# Patient Record
Sex: Female | Born: 1990 | Race: White | Hispanic: No | Marital: Married | Smoking: Never smoker
Health system: Southern US, Community
[De-identification: ages and names within clinical notes are randomized; demographics above are authoritative.]

## PROBLEM LIST (undated history)

## (undated) ENCOUNTER — Inpatient Hospital Stay (HOSPITAL_COMMUNITY): Payer: Self-pay

## (undated) DIAGNOSIS — IMO0002 Reserved for concepts with insufficient information to code with codable children: Secondary | ICD-10-CM

## (undated) DIAGNOSIS — T8859XA Other complications of anesthesia, initial encounter: Secondary | ICD-10-CM

## (undated) DIAGNOSIS — E039 Hypothyroidism, unspecified: Secondary | ICD-10-CM

## (undated) DIAGNOSIS — D62 Acute posthemorrhagic anemia: Secondary | ICD-10-CM

## (undated) DIAGNOSIS — T4145XA Adverse effect of unspecified anesthetic, initial encounter: Secondary | ICD-10-CM

## (undated) DIAGNOSIS — N83209 Unspecified ovarian cyst, unspecified side: Secondary | ICD-10-CM

## (undated) DIAGNOSIS — IMO0001 Reserved for inherently not codable concepts without codable children: Secondary | ICD-10-CM

## (undated) DIAGNOSIS — R87629 Unspecified abnormal cytological findings in specimens from vagina: Secondary | ICD-10-CM

## (undated) DIAGNOSIS — E079 Disorder of thyroid, unspecified: Secondary | ICD-10-CM

## (undated) DIAGNOSIS — K219 Gastro-esophageal reflux disease without esophagitis: Secondary | ICD-10-CM

## (undated) HISTORY — PX: APPENDECTOMY: SHX54

## (undated) HISTORY — PX: MOUTH SURGERY: SHX715

## (undated) HISTORY — PX: OTHER SURGICAL HISTORY: SHX169

---
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
## 2009-04-30 ENCOUNTER — Encounter (INDEPENDENT_AMBULATORY_CARE_PROVIDER_SITE_OTHER): Payer: Self-pay | Admitting: General Surgery

## 2009-04-30 ENCOUNTER — Inpatient Hospital Stay (HOSPITAL_COMMUNITY): Admission: EM | Admit: 2009-04-30 | Discharge: 2009-05-02 | Payer: Self-pay | Admitting: Emergency Medicine

## 2010-08-31 LAB — DIFFERENTIAL
Basophils Relative: 0 % (ref 0–1)
Lymphs Abs: 2.1 10*3/uL (ref 0.7–4.0)

## 2010-08-31 LAB — URINALYSIS, ROUTINE W REFLEX MICROSCOPIC
Glucose, UA: NEGATIVE mg/dL
Ketones, ur: NEGATIVE mg/dL
Nitrite: NEGATIVE
pH: 6.5 (ref 5.0–8.0)

## 2010-08-31 LAB — WET PREP, GENITAL
Clue Cells Wet Prep HPF POC: NONE SEEN
Yeast Wet Prep HPF POC: NONE SEEN

## 2010-08-31 LAB — BASIC METABOLIC PANEL
BUN: 12 mg/dL (ref 6–23)
Calcium: 9.2 mg/dL (ref 8.4–10.5)
Glucose, Bld: 126 mg/dL — ABNORMAL HIGH (ref 70–99)
Potassium: 3.8 mEq/L (ref 3.5–5.1)

## 2010-08-31 LAB — GC/CHLAMYDIA PROBE AMP, GENITAL
Chlamydia, DNA Probe: NEGATIVE
GC Probe Amp, Genital: NEGATIVE

## 2010-08-31 LAB — CBC
HCT: 38.8 % (ref 36.0–46.0)
MCHC: 34.1 g/dL (ref 30.0–36.0)
MCV: 87.7 fL (ref 78.0–100.0)
RBC: 4.43 MIL/uL (ref 3.87–5.11)
RDW: 13 % (ref 11.5–15.5)
WBC: 11.9 10*3/uL — ABNORMAL HIGH (ref 4.0–10.5)

## 2010-08-31 LAB — POCT PREGNANCY, URINE: Preg Test, Ur: NEGATIVE

## 2010-10-12 NOTE — Consult Note (Signed)
NAME:  Jane, Ayers NO.:  [id number]   MEDICAL RECORD NO.:  [id number]          PATIENT TYPE:  EMS   LOCATION:  MAJO                         FACILITY:  [facility]   PHYSICIAN:  Deanna Artis. Hickling, M.D.DATE OF BIRTH:  05-07-1991   DATE OF CONSULTATION:  DATE OF DISCHARGE:  07/24/2007                                 CONSULTATION   CHIEF COMPLAINT:  Left-sided weakness.   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   The patient has noted onset of headache that began 4 days ago in the  left posterior region and is now extending posteriorly and toward the  right side.  This is a dull achy but moderately severe pain.   The patient has complained of blurred vision in her left eye.  She noted  on Sunday that she had some problems with facial droop and that her eyes  seemed to be closing at different times.   She had alterations in taste on the left side of her tongue on Sunday.  The headache persisted.   On Monday, she had increasing problems with the left body,  particularly  her hand and to a lesser extent her leg.  Her mother said that she was  walking fine when she came into the emergency room.  She is having some  difficulty at this time.   REVIEW OF SYSTEMS:  CONSTITUTIONAL:  The patient has normal sleep and  appetite.  HEAD AND NECK:  No otitis media, pharyngitis, sinusitis.  PULMONARY:  No asthma, bronchitis, pneumonia.  CARDIOVASCULAR:  No  hypertension, murmur or congenital heart disease.  GASTROINTESTINAL:  No  nausea, vomiting, diarrhea, constipation.  GENITOURINARY:  No urinary  tract infection or  hematuria.  MUSCULOSKELETAL:  No fractures, sprains  or deformities.  SKIN:  No rash.  She has a small cafe-au-lait spot on  her right arm.  ENDOCRINE:  No diabetes.  She  has hypothyroidism,  etiology unknown.  NEUROPSYCHIATRIC:  Attention deficit disorder.  No  depression, anxiety and no prior history of histrionic behavior.  NEUROLOGIC:  The patient's review of systems is  coincident with the  history of the present illness.  The patient has had no other problems  with urinary incontinence, weakness on the right side, problems with  swallowing or chewing, changes in her hearing, or loss of control of her  bowels and bladder.  No seizures.   CURRENT MEDICATIONS:  Microgestin 1.5/30 for 21 days, which started 3  months ago.  She is in the middle of her period at this time.  Levothyroxine 50 mcg.  Daytrana 20-mg patch per day.   DRUG ALLERGIES:  PENICILLIN (HIVES).   FAMILY HISTORY:  No history of migraines.  Grandfather has hypertension,  diabetes.  Grandmother died from a ruptured brain aneurysm at age 74.  There are two brothers of hers with brain aneurysms.  No history of  mental retardation, blindness, deafness, birth defects  or autism.   SOCIAL HISTORY:  The patient is a Holiday representative at [employer],  getting Bs and Cs.  She plans to go to [employer] to study  veterinary medicine.   She does not use tobacco or alcohol.  She does not have a boyfriend.  She does not use drugs.   PHYSICAL EXAMINATION:  GENERAL:  On examination today, a well-developed,  well-nourished young woman who is tearful beyond what the situation  would predict.  VITAL SIGNS:  Temperature 97.6, blood pressure 137/87, resting pulse 98,  respirations 22, oxygen saturation 100%.  HEENT:  No signs of infection.  NECK:  Supple.  Full range of motion.  No cranial or cervical bruits.  LUNGS:  Clear to auscultation.  HEART:  No murmurs.  Pulses normal.  ABDOMEN:  Soft, nontender.  Bowel sounds normal.  EXTREMITIES:  Well-  formed without edema, cyanosis, or alterations in tone or tight heel  cords.  SKIN:  No lesions.  Vascular tone was normal.  NEUROLOGIC:  Mental  status:  The patient was awake and alert.  Her  vision was 20/100 -1 OS and 20/20 OD.  Fundi appeared to be normal.  Visual fields were full to double simultaneous stimuli.  Extraocular  movements full and conjugate.  She has a left peripheral facial weakness  with inability to elevate her eyebrow.  She can barely close her eyelid.  She has a facial droop.  She has a midline tongue and uvula.  Air  conduction greater than bone conduction bilaterally.  Motor examination:  The patient has no drift in her arms or her legs.  She said that she  could not hold her arms up, but she was able to do so.  She had  excellent strength on the right side, good fine motor movements.  On the  left side, she had giveaway strength.  She had some difficulty with  tapping her thumbs and her fingers, but it seemed forced.  She had much  better fine motor skills when she was performing stereognosis than when  she was trying to finger tap.  She had actually quite good strength in  her lower extremities as well, although she did not wiggle her toes as  well on the left side as the right.   Sensation showed a hypesthesia that respected the midline on her face.  She perceived a tuning fork greater on the right side than the left and  also pinprick greater right than left as well as cold right greater than  left.  The numbness was not as prominent on her neck, but it was present  on her shoulder and in her right arm and involved just the dermatomes of  the right arm and not her chest.  There also was some numbness on the  left leg in comparison with the right.  Despite that,  she had good  proprioception.  Vibratory sensation was somewhat decreased, right and  left.  She had good stereoagnosis bilaterally.  Cerebellar examination:  Good finger-to-nose.  No tremor.  Gait showed a slight left hemiparesis  although she did not circumduct the leg.  She scraped the toe along the  ground as she walked.  She was not  unsteady on her feet.  She could  perform a tandem.  She was able to walk on her toe and her heels.  She  had a negative Romberg.  Reflexes were symmetric and diminished.  There  was no reflex predominance.  She had bilateral flexor plantar responses.   IMPRESSION:  1. Left Bell's palsy. (351.0)  2. I cannot rule out a left optic neuritis.  3. Factitious left body examination in terms of sensation and motor in      my opinion. (342.82)   PLAN:  We need to look for the presence of demyelinating disease in her  optic nerves and in her brain.  This could explain all the findings that  we have and needs to be done because the patient should if this is true  be treated with 1000 mg of IV Solu-Medrol for 3 days.   Unfortunately, though I tried to speak out of her ear shot, she heard me  speaking with her mother, and she thinks that I do not believe her  concerning any of her conditions, which is not the case.  I pointed out  to her that we had to do the MRI scan to make certain that she did not  lose vision in her eye.  At this point, she is quite angry and wishes to  leave, but I was able to persuade her to go through the MRI scan.      Deanna Artis. Sharene Skeans, M.D.  Electronically Signed     [facility]  D:  07/24/2007  T:  07/24/2007  Job:  [id number]   cc:   Maryelizabeth Rowan, M.D.

## 2011-02-18 LAB — BASIC METABOLIC PANEL
BUN: 10
Calcium: 9.3
Chloride: 105
Potassium: 4.2

## 2011-02-18 LAB — DIFFERENTIAL
Eosinophils Absolute: 0.1
Lymphocytes Relative: 30

## 2011-02-18 LAB — PROTIME-INR
INR: 1
Prothrombin Time: 12.9

## 2011-02-18 LAB — APTT: aPTT: 26

## 2011-02-18 LAB — CBC
Platelets: 295
WBC: 5.3

## 2011-08-07 ENCOUNTER — Emergency Department (HOSPITAL_COMMUNITY)
Admission: EM | Admit: 2011-08-07 | Discharge: 2011-08-07 | Disposition: A | Discharge status: Home / Self Care | Payer: Private Health Insurance - Indemnity | Attending: Emergency Medicine | Admitting: Emergency Medicine

## 2011-08-07 ENCOUNTER — Encounter (HOSPITAL_COMMUNITY): Payer: Self-pay | Admitting: *Deleted

## 2011-08-07 DIAGNOSIS — F1010 Alcohol abuse, uncomplicated: Secondary | ICD-10-CM | POA: Insufficient documentation

## 2011-08-07 DIAGNOSIS — F10929 Alcohol use, unspecified with intoxication, unspecified: Secondary | ICD-10-CM

## 2011-08-07 LAB — ACETAMINOPHEN LEVEL: Acetaminophen (Tylenol), Serum: <15 ug/mL (ref 10–30)

## 2011-08-07 LAB — ETHANOL: Alcohol, Ethyl (B): 168 mg/dL — ABNORMAL HIGH (ref 0–11)

## 2011-08-07 LAB — RAPID URINE DRUG SCREEN, HOSP PERFORMED
Amphetamines: NOT DETECTED
Benzodiazepines: NOT DETECTED
Opiates: NOT DETECTED
Tetrahydrocannabinol: NOT DETECTED

## 2011-08-07 LAB — CBC
HCT: 38 % (ref 36.0–46.0)
MCH: 29.5 pg (ref 26.0–34.0)
MCHC: 34.2 g/dL (ref 30.0–36.0)
MCV: 86.4 fL (ref 78.0–100.0)
Platelets: 260 10*3/uL (ref 150–400)
RDW: 13.1 % (ref 11.5–15.5)

## 2011-08-07 LAB — COMPREHENSIVE METABOLIC PANEL
Albumin: 4.2 g/dL (ref 3.5–5.2)
BUN: 8 mg/dL (ref 6–23)
Calcium: 8.6 mg/dL (ref 8.4–10.5)
Chloride: 100 mEq/L (ref 96–112)
Creatinine, Ser: 0.67 mg/dL (ref 0.50–1.10)
Total Bilirubin: 0.2 mg/dL — ABNORMAL LOW (ref 0.3–1.2)
Total Protein: 7.5 g/dL (ref 6.0–8.3)

## 2011-08-07 LAB — URINALYSIS, ROUTINE W REFLEX MICROSCOPIC
Bilirubin Urine: NEGATIVE
Glucose, UA: NEGATIVE mg/dL
Hgb urine dipstick: NEGATIVE
Ketones, ur: NEGATIVE mg/dL
Protein, ur: NEGATIVE mg/dL
Urobilinogen, UA: 0.2 mg/dL (ref 0.0–1.0)

## 2011-08-07 LAB — POCT PREGNANCY, URINE: Preg Test, Ur: NEGATIVE

## 2011-08-07 MED ORDER — ZIPRASIDONE MESYLATE 20 MG IM SOLR
INTRAMUSCULAR | Status: AC
  Administered 2011-08-07: 20 mg via INTRAMUSCULAR
  Filled 2011-08-07: qty 20

## 2011-08-07 MED ORDER — AMMONIA AROMATIC IN INHA
RESPIRATORY_TRACT | Status: AC
  Administered 2011-08-07: 01:00:00
  Filled 2011-08-07: qty 10

## 2011-08-07 MED ORDER — SODIUM CHLORIDE 0.9 % IV BOLUS (SEPSIS)
500.0000 mL | Freq: Once | INTRAVENOUS | Status: AC
  Administered 2011-08-07: 500 mL via INTRAVENOUS

## 2011-08-07 MED ORDER — ZIPRASIDONE MESYLATE 20 MG IM SOLR
20.0000 mg | Freq: Once | INTRAMUSCULAR | Status: AC
  Administered 2011-08-07: 20 mg via INTRAMUSCULAR

## 2011-08-07 MED ORDER — SODIUM CHLORIDE 0.9 % IV BOLUS (SEPSIS)
1000.0000 mL | Freq: Once | INTRAVENOUS | Status: AC
  Administered 2011-08-07: 1000 mL via INTRAVENOUS

## 2011-08-07 MED ORDER — POTASSIUM CHLORIDE 10 MEQ/100ML IV SOLN
10.0000 meq | Freq: Once | INTRAVENOUS | Status: AC
  Administered 2011-08-07: 10 meq via INTRAVENOUS
  Filled 2011-08-07: qty 100

## 2011-08-07 NOTE — ED Provider Notes (Signed)
Patient reassessed at 9 AM. She is awake and alert in conversation with her mother. She is oriented x3 and she is tolerating her breakfast. She denies any trauma. She has no pain complaints.  She denies any suicidal ideation. Will assure patient can ambulate and discharge to the mother.  BP 94/44  Pulse 104  Temp 96.9 F (36.1 C)  Resp 18  SpO2 97%   Glynn Octave, MD 08/07/11 [phone]

## 2011-08-07 NOTE — Discharge Instructions (Signed)
Alcohol Intoxication  You have alcohol intoxication when the amount of alcohol that you have consumed has impaired your ability to mentally and physically function. There are a variety of factors that contribute to the level at which alcohol intoxication can occur, such as age, gender, weight, frequency of alcohol consumption, medication use, and the presence of other medical conditions, such as diabetes, seizures, or heart conditions.  The blood alcohol level test measures the concentration of alcohol in your blood. In most states, your blood alcohol level must be lower than 80 mg/dL (0.08%) to legally drive. However, many dangerous effects of alcohol can occur at much lower levels.  Alcohol directly impairs the normal chemical activity of the brain and is said to be a chemical depressant. Alcohol can cause drowsiness, stupor, respiratory failure, and coma. Other physical effects can include headache, vomiting, vomiting of blood, abdominal pain, a fast heartbeat, difficulty breathing, anxiety, and amnesia. Alcohol intoxication can also lead to dangerous and life-threatening activities, such as fighting, dangerous operation of vehicles or heavy machinery, and risky sexual behavior.  Alcohol can be especially dangerous when taken with other drugs. Some of these drugs are:   Sedatives.   Painkillers.   Marijuana.   Tranquilizers.   Antihistamines.   Muscle relaxants.   Seizure medicine.  Many of the effects of acute alcohol intoxication are temporary. However, repeated alcohol intoxication can lead to severe medical illnesses. If you have alcohol intoxication, you should:   Stay hydrated. Drink enough water and fluids to keep your urine clear or pale yellow. Avoid excessive caffeine because this can further lead to dehydration.   Eat a healthy diet. You may have residual nausea, headache, and loss of appetite, but it is still important that you maintain good nutrition. You can start with clear  liquids.   Take nonsteroidal anti-inflammatory medications as needed for headaches, but make sure to do so with small meals. You should avoid acetaminophen for several days after having alcohol intoxication because the combination of alcohol and acetaminophen can be toxic to your liver.  If you have frequent alcohol intoxication, ask your friends and family if they think you have a drinking problem. For further help, contact:   Your caregiver.   Alcoholics Anonymous (AA).   A drug or alcohol rehabilitation program.  SEEK MEDICAL CARE IF:    You have persistent vomiting.   You have persistent pain in any part of your body.   You do not feel better after a few days.  SEEK IMMEDIATE MEDICAL CARE IF:    You become shaky or tremble when you try to stop drinking.   You shake uncontrollably (seizure).   You throw up (vomit) blood. This may be bright red or it may look like black coffee grounds.   You have blood in the stool. This may be bright red or appear as a black, tarry, bad smelling stool.   You become lightheaded or faint.  ANY OF THESE SYMPTOMS MAY REPRESENT A SERIOUS PROBLEM THAT IS AN EMERGENCY. Do not wait to see if the symptoms will go away. Get medical help right away. Call your local emergency services (911 in [place] DO NOT drive yourself to the hospital.  MAKE SURE YOU:    Understand these instructions.   Will watch your condition.   Will get help right away if you are not doing well or get worse.  Document Released: 02/23/2005 Document Revised: 05/05/2011 Document Reviewed: 11/02/2009  [employer] Patient Information 2012 [employer].

## 2011-08-07 NOTE — ED Notes (Signed)
Patient is alert and oriented x3.  She was given DC instructions and follow up visit instructions.  Patient gave verbal understanding. She was DC ambulatory under his own power to home.  V/S stable.  He was not showing any signs of distress on DC 

## 2011-08-07 NOTE — ED Notes (Signed)
20gauge left hand,  NS fluid 500 cc

## 2011-08-07 NOTE — ED Notes (Signed)
Pt brought in via EMS from [place], where she was found to be intoxicated arousable only to stimuli,  rr unlabored and even at 16 upon arrival,  No past history reported or allergies.
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
## 2011-08-07 NOTE — ED Notes (Signed)
MD at bedside. 

## 2011-08-31 ENCOUNTER — Other Ambulatory Visit: Payer: Self-pay | Admitting: Family Medicine

## 2011-08-31 ENCOUNTER — Ambulatory Visit
Admission: RE | Admit: 2011-08-31 | Discharge: 2011-08-31 | Disposition: A | Discharge status: Home / Self Care | Payer: 59 | Source: Ambulatory Visit | Attending: Family Medicine | Admitting: Family Medicine

## 2011-08-31 DIAGNOSIS — R0981 Nasal congestion: Secondary | ICD-10-CM

## 2012-01-19 ENCOUNTER — Inpatient Hospital Stay (HOSPITAL_COMMUNITY)
Admission: AD | Admit: 2012-01-19 | Discharge: 2012-01-19 | Disposition: A | Discharge status: Home / Self Care | Payer: 59 | Source: Ambulatory Visit | Attending: Obstetrics & Gynecology | Admitting: Obstetrics & Gynecology

## 2012-01-19 ENCOUNTER — Inpatient Hospital Stay (HOSPITAL_COMMUNITY): Payer: 59

## 2012-01-19 ENCOUNTER — Encounter (HOSPITAL_COMMUNITY): Payer: Self-pay | Admitting: *Deleted

## 2012-01-19 DIAGNOSIS — K5900 Constipation, unspecified: Secondary | ICD-10-CM

## 2012-01-19 DIAGNOSIS — R1031 Right lower quadrant pain: Secondary | ICD-10-CM | POA: Insufficient documentation

## 2012-01-19 HISTORY — DX: Disorder of thyroid, unspecified: E07.9

## 2012-01-19 HISTORY — DX: Unspecified ovarian cyst, unspecified side: N83.209

## 2012-01-19 HISTORY — DX: Reserved for concepts with insufficient information to code with codable children: IMO0002

## 2012-01-19 LAB — URINALYSIS, ROUTINE W REFLEX MICROSCOPIC
Bilirubin Urine: NEGATIVE
Glucose, UA: NEGATIVE mg/dL
Ketones, ur: NEGATIVE mg/dL
pH: 5.5 (ref 5.0–8.0)

## 2012-01-19 MED ORDER — KETOROLAC TROMETHAMINE 30 MG/ML IJ SOLN
30.0000 mg | Freq: Once | INTRAMUSCULAR | Status: DC
Start: 2012-01-19 — End: 2012-01-20
  Filled 2012-01-19: qty 1

## 2012-01-19 NOTE — Treatment Plan (Signed)
Dr Juliene Pina notified of pts urine results and decline for Torodol. Patient may d/c home
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
## 2012-01-19 NOTE — MAU Note (Signed)
Pain started <1 ago, severe RLQ pain.  Hx of ovarian cysts- ruptured and surgical removal. Has had appendectomy.  Nausea, no vomiting, diarrhea, constipation. Denies any urinary symptoms.

## 2015-04-02 ENCOUNTER — Inpatient Hospital Stay (HOSPITAL_COMMUNITY)
Admission: AD | Admit: 2015-04-02 | Discharge: 2015-04-02 | Disposition: A | Discharge status: Home / Self Care | Source: Ambulatory Visit | Attending: Obstetrics and Gynecology | Admitting: Obstetrics and Gynecology

## 2015-04-02 ENCOUNTER — Encounter (HOSPITAL_COMMUNITY): Payer: Self-pay | Admitting: *Deleted

## 2015-04-02 DIAGNOSIS — Z880 Allergy status to penicillin: Secondary | ICD-10-CM | POA: Diagnosis not present

## 2015-04-02 DIAGNOSIS — O210 Mild hyperemesis gravidarum: Secondary | ICD-10-CM | POA: Diagnosis not present

## 2015-04-02 DIAGNOSIS — Z3A01 Less than 8 weeks gestation of pregnancy: Secondary | ICD-10-CM | POA: Diagnosis not present

## 2015-04-02 LAB — URINALYSIS, ROUTINE W REFLEX MICROSCOPIC
Bilirubin Urine: NEGATIVE
GLUCOSE, UA: NEGATIVE mg/dL
Hgb urine dipstick: NEGATIVE
KETONES UR: 15 mg/dL — AB
LEUKOCYTES UA: NEGATIVE
NITRITE: NEGATIVE
PROTEIN: NEGATIVE mg/dL
Specific Gravity, Urine: 1.025 (ref 1.005–1.030)
Urobilinogen, UA: 4 mg/dL — ABNORMAL HIGH (ref 0.0–1.0)
pH: 6.5 (ref 5.0–8.0)

## 2015-04-02 MED ORDER — SODIUM CHLORIDE 0.9 % IV SOLN
Freq: Once | INTRAVENOUS | Status: AC
  Administered 2015-04-02: 17:00:00 via INTRAVENOUS

## 2015-04-02 MED ORDER — LACTATED RINGERS IV BOLUS (SEPSIS)
1000.0000 mL | Freq: Once | INTRAVENOUS | Status: AC
  Administered 2015-04-02: 1000 mL via INTRAVENOUS

## 2015-04-02 MED ORDER — SODIUM CHLORIDE 0.9 % IV SOLN: Freq: Once | INTRAVENOUS | Status: DC

## 2015-04-02 MED ORDER — SODIUM CHLORIDE 0.9 % IV SOLN
8.0000 mg | Freq: Once | INTRAVENOUS | Status: AC
  Administered 2015-04-02: 8 mg via INTRAVENOUS
  Filled 2015-04-02: qty 4

## 2015-04-02 MED ORDER — PANTOPRAZOLE SODIUM 40 MG IV SOLR
40.0000 mg | Freq: Once | INTRAVENOUS | Status: AC
  Administered 2015-04-02: 40 mg via INTRAVENOUS
  Filled 2015-04-02: qty 40

## 2015-04-02 NOTE — MAU Note (Signed)
Non-stop vomiting.  Dr sent her.

## 2015-04-02 NOTE — Progress Notes (Signed)
Left a message with the provider to call back for an update.

## 2015-04-02 NOTE — Progress Notes (Signed)
Left a message with provider to call MAU for update on pt's condition.

## 2015-04-02 NOTE — Progress Notes (Signed)
Left message for provider to discuss medication orders.

## 2015-04-02 NOTE — H&P (Signed)
Chief complaint: Nausea and vomiting  History of present illness: 24 year old G2 P1 001 at 7 weeks and 5 days who presents with emesis all day. Patient called the outpatient office and had been prescribed Phenergan and Zofran. She had already been on dye clean just and Zantac. Despite the additional medication patient has been unable to control her emesis. By midday patient notes dizziness with standing and was recommended to come to the ER for evaluation.  Past Medical History  Diagnosis Date  . Thyroid disorder   . Ovarian cyst   . Abnormal Pap smear     Past Surgical History  Procedure Laterality Date  . Appendectomy    . Cesarean section    . Cyst removal from left ovary     allergies: Penicillin  Medications: Zantac, dye clean just, prenatal vitamin    PE: Filed Vitals:   04/02/15 1523  BP: 122/78  Pulse: 103  Temp: 98.8 F (37.1 C)  TempSrc: Oral  Resp: 18  Weight: 193 lb 12.8 oz (87.907 kg)   general: Patient seen after 2 L of IV fluid: Well appearing, no distress Skin: Warm and dry Cardiovascular: Regular rate and rhythm Pulmonary: Clear to auscultation bilaterally Abdomen: Nontender, nondistended GU: Deferred Lower extremity: No edema  MAU course: Patient was given 1 L bolus LR followed by 2 L bolus normal saline. She received IV Zofran and IV proton X. At the start of her third bag of IV fluids patient notes standing without dizziness no current nausea and able to tolerate by mouth  Assessment and plan: Hyperemesis of pregnancy versus viral GI illness. Discharge to home after third liter of IV fluid. Patient is to continue on oral Zantac and oral dye clean just with addition of Zofran as needed. Patient is instructed on BRAT diet.  Ameliyah Sarno A. 04/02/2015 6:45 PM

## 2015-04-02 NOTE — Progress Notes (Signed)
Provider notified of pt in MAU.  Informed that pt is feeling better but needs to be seen by provider before discharge.  Provider stated she will contact Dr. Ernestina PennaFogleman and one of them will come see the pt shortly.

## 2015-04-02 NOTE — Progress Notes (Signed)
Provider notified that pt took Zofran at 1200. Provider still wants IV Zofran given and wants a second bag of NS hung when first bag of LR finishes.

## 2015-05-12 ENCOUNTER — Inpatient Hospital Stay (HOSPITAL_COMMUNITY)
Admission: AD | Admit: 2015-05-12 | Discharge: 2015-05-12 | Disposition: A | Discharge status: Home / Self Care | Source: Ambulatory Visit | Attending: Obstetrics & Gynecology | Admitting: Obstetrics & Gynecology

## 2015-05-12 ENCOUNTER — Inpatient Hospital Stay (HOSPITAL_COMMUNITY)

## 2015-05-12 ENCOUNTER — Encounter (HOSPITAL_COMMUNITY): Payer: Self-pay

## 2015-05-12 DIAGNOSIS — Z888 Allergy status to other drugs, medicaments and biological substances status: Secondary | ICD-10-CM | POA: Insufficient documentation

## 2015-05-12 DIAGNOSIS — Z91040 Latex allergy status: Secondary | ICD-10-CM | POA: Insufficient documentation

## 2015-05-12 DIAGNOSIS — Z880 Allergy status to penicillin: Secondary | ICD-10-CM | POA: Insufficient documentation

## 2015-05-12 DIAGNOSIS — J069 Acute upper respiratory infection, unspecified: Secondary | ICD-10-CM | POA: Diagnosis not present

## 2015-05-12 DIAGNOSIS — R51 Headache: Secondary | ICD-10-CM | POA: Diagnosis not present

## 2015-05-12 DIAGNOSIS — Z881 Allergy status to other antibiotic agents status: Secondary | ICD-10-CM | POA: Insufficient documentation

## 2015-05-12 DIAGNOSIS — Z3A13 13 weeks gestation of pregnancy: Secondary | ICD-10-CM | POA: Diagnosis not present

## 2015-05-12 DIAGNOSIS — O99511 Diseases of the respiratory system complicating pregnancy, first trimester: Secondary | ICD-10-CM | POA: Diagnosis not present

## 2015-05-12 DIAGNOSIS — R0602 Shortness of breath: Secondary | ICD-10-CM | POA: Diagnosis present

## 2015-05-12 LAB — CBC WITH DIFFERENTIAL/PLATELET
BASOS ABS: 0 10*3/uL (ref 0.0–0.1)
BASOS PCT: 0 %
EOS ABS: 0.1 10*3/uL (ref 0.0–0.7)
EOS PCT: 1 %
HCT: 38 % (ref 36.0–46.0)
HEMOGLOBIN: 12.8 g/dL (ref 12.0–15.0)
LYMPHS ABS: 0.5 10*3/uL — AB (ref 0.7–4.0)
Lymphocytes Relative: 7 %
MCH: 29.7 pg (ref 26.0–34.0)
MCHC: 33.7 g/dL (ref 30.0–36.0)
MCV: 88.2 fL (ref 78.0–100.0)
Monocytes Absolute: 0.6 10*3/uL (ref 0.1–1.0)
Monocytes Relative: 8 %
NEUTROS PCT: 84 %
Neutro Abs: 6.2 10*3/uL (ref 1.7–7.7)
PLATELETS: 212 10*3/uL (ref 150–400)
RBC: 4.31 MIL/uL (ref 3.87–5.11)
RDW: 12.9 % (ref 11.5–15.5)
WBC: 7.4 10*3/uL (ref 4.0–10.5)

## 2015-05-12 LAB — URINALYSIS, ROUTINE W REFLEX MICROSCOPIC
BILIRUBIN URINE: NEGATIVE
Glucose, UA: NEGATIVE mg/dL
Hgb urine dipstick: NEGATIVE
Ketones, ur: 15 mg/dL — AB
Leukocytes, UA: NEGATIVE
NITRITE: NEGATIVE
PROTEIN: NEGATIVE mg/dL
SPECIFIC GRAVITY, URINE: 1.02 (ref 1.005–1.030)
pH: >9 — ABNORMAL HIGH (ref 5.0–8.0)

## 2015-05-12 MED ORDER — ACETAMINOPHEN 500 MG PO TABS
1000.0000 mg | ORAL_TABLET | Freq: Once | ORAL | Status: AC
  Administered 2015-05-12: 1000 mg via ORAL
  Filled 2015-05-12: qty 2

## 2015-05-12 MED ORDER — ALBUTEROL SULFATE HFA 108 (90 BASE) MCG/ACT IN AERS: 2.0000 | INHALATION_SPRAY | Freq: Four times a day (QID) | RESPIRATORY_TRACT | Status: DC | PRN

## 2015-05-12 MED ORDER — ALBUTEROL SULFATE (2.5 MG/3ML) 0.083% IN NEBU
2.5000 mg | INHALATION_SOLUTION | Freq: Once | RESPIRATORY_TRACT | Status: AC
  Administered 2015-05-12: 2.5 mg via RESPIRATORY_TRACT
  Filled 2015-05-12: qty 3

## 2015-05-12 NOTE — Progress Notes (Signed)
Notified of pt arrival in MAU and complaint. Will call back

## 2015-05-12 NOTE — MAU Note (Signed)
Pt presents complaining of difficulty breathing and cold symptoms x2 weeks. Denies bleeding or leaking.
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
## 2015-05-12 NOTE — Discharge Instructions (Signed)
Upper Respiratory Infection, Adult °Most upper respiratory infections (URIs) are a viral infection of the air passages leading to the lungs. A URI affects the nose, throat, and upper air passages. The most common type of URI is nasopharyngitis and is typically referred to as "the common cold." °URIs run their course and usually go away on their own. Most of the time, a URI does not require medical attention, but sometimes a bacterial infection in the upper airways can follow a viral infection. This is called a secondary infection. Sinus and middle ear infections are common types of secondary upper respiratory infections. °Bacterial pneumonia can also complicate a URI. A URI can worsen asthma and chronic obstructive pulmonary disease (COPD). Sometimes, these complications can require emergency medical care and may be life threatening.  °CAUSES °Almost all URIs are caused by viruses. A virus is a type of germ and can spread from one person to another.  °RISKS FACTORS °You may be at risk for a URI if:  °· You smoke.   °· You have chronic heart or lung disease. °· You have a weakened defense (immune) system.   °· You are very young or very old.   °· You have nasal allergies or asthma. °· You work in crowded or poorly ventilated areas. °· You work in health care facilities or schools. °SIGNS AND SYMPTOMS  °Symptoms typically develop 2-3 days after you come in contact with a cold virus. Most viral URIs last 7-10 days. However, viral URIs from the influenza virus (flu virus) can last 14-18 days and are typically more severe. Symptoms may include:  °· Runny or stuffy (congested) nose.   °· Sneezing.   °· Cough.   °· Sore throat.   °· Headache.   °· Fatigue.   °· Fever.   °· Loss of appetite.   °· Pain in your forehead, behind your eyes, and over your cheekbones (sinus pain). °· Muscle aches.   °DIAGNOSIS  °Your health care provider may diagnose a URI by: °· Physical exam. °· Tests to check that your symptoms are not due to  another condition such as: °¨ Strep throat. °¨ Sinusitis. °¨ Pneumonia. °¨ Asthma. °TREATMENT  °A URI goes away on its own with time. It cannot be cured with medicines, but medicines may be prescribed or recommended to relieve symptoms. Medicines may help: °· Reduce your fever. °· Reduce your cough. °· Relieve nasal congestion. °HOME CARE INSTRUCTIONS  °· Take medicines only as directed by your health care provider.   °· Gargle warm saltwater or take cough drops to comfort your throat as directed by your health care provider. °· Use a warm mist humidifier or inhale steam from a shower to increase air moisture. This may make it easier to breathe. °· Drink enough fluid to keep your urine clear or pale yellow.   °· Eat soups and other clear broths and maintain good nutrition.   °· Rest as needed.   °· Return to work when your temperature has returned to normal or as your health care provider advises. You may need to stay home longer to avoid infecting others. You can also use a face mask and careful hand washing to prevent spread of the virus. °· Increase the usage of your inhaler if you have asthma.   °· Do not use any tobacco products, including cigarettes, chewing tobacco, or electronic cigarettes. If you need help quitting, ask your health care provider. °PREVENTION  °The best way to protect yourself from getting a cold is to practice good hygiene.  °· Avoid oral or hand contact with people with cold   symptoms.   °· Wash your hands often if contact occurs.   °There is no clear evidence that vitamin C, vitamin E, echinacea, or exercise reduces the chance of developing a cold. However, it is always recommended to get plenty of rest, exercise, and practice good nutrition.  °SEEK MEDICAL CARE IF:  °· You are getting worse rather than better.   °· Your symptoms are not controlled by medicine.   °· You have chills. °· You have worsening shortness of breath. °· You have brown or red mucus. °· You have yellow or brown nasal  discharge. °· You have pain in your face, especially when you bend forward. °· You have a fever. °· You have swollen neck glands. °· You have pain while swallowing. °· You have white areas in the back of your throat. °SEEK IMMEDIATE MEDICAL CARE IF:  °· You have severe or persistent: °¨ Headache. °¨ Ear pain. °¨ Sinus pain. °¨ Chest pain. °· You have chronic lung disease and any of the following: °¨ Wheezing. °¨ Prolonged cough. °¨ Coughing up blood. °¨ A change in your usual mucus. °· You have a stiff neck. °· You have changes in your: °¨ Vision. °¨ Hearing. °¨ Thinking. °¨ Mood. °MAKE SURE YOU:  °· Understand these instructions. °· Will watch your condition. °· Will get help right away if you are not doing well or get worse. °  °This information is not intended to replace advice given to you by your health care provider. Make sure you discuss any questions you have with your health care provider. °  °Document Released: 11/09/2000 Document Revised: 09/30/2014 Document Reviewed: 08/21/2013 °Elsevier Interactive Patient Education ©2016 [employer] ° °Cool Mist Vaporizers °Vaporizers may help relieve the symptoms of a cough and cold. They add moisture to the air, which helps mucus to become thinner and less sticky. This makes it easier to breathe and cough up secretions. Cool mist vaporizers do not cause serious burns like hot mist vaporizers, which may also be called steamers or humidifiers. Vaporizers have not been proven to help with colds. You should not use a vaporizer if you are allergic to mold. °HOME CARE INSTRUCTIONS °· Follow the package instructions for the vaporizer. °· Do not use anything other than distilled water in the vaporizer. °· Do not run the vaporizer all of the time. This can cause mold or bacteria to grow in the vaporizer. °· Clean the vaporizer after each time it is used. °· Clean and dry the vaporizer well before storing it. °· Stop using the vaporizer if worsening respiratory symptoms  develop. °  °This information is not intended to replace advice given to you by your health care provider. Make sure you discuss any questions you have with your health care provider. °  °Document Released: 02/11/2004 Document Revised: 05/21/2013 Document Reviewed: 10/03/2012 °Elsevier Interactive Patient Education ©2016 [employer]. ° °

## 2015-08-26 ENCOUNTER — Inpatient Hospital Stay (HOSPITAL_COMMUNITY)
Admission: AD | Admit: 2015-08-26 | Discharge: 2015-08-27 | Disposition: A | Discharge status: Home / Self Care | Source: Ambulatory Visit | Attending: Obstetrics | Admitting: Obstetrics

## 2015-08-26 ENCOUNTER — Encounter (HOSPITAL_COMMUNITY): Payer: Self-pay | Admitting: *Deleted

## 2015-08-26 DIAGNOSIS — O99613 Diseases of the digestive system complicating pregnancy, third trimester: Secondary | ICD-10-CM | POA: Diagnosis not present

## 2015-08-26 DIAGNOSIS — Z3A28 28 weeks gestation of pregnancy: Secondary | ICD-10-CM | POA: Insufficient documentation

## 2015-08-26 DIAGNOSIS — O212 Late vomiting of pregnancy: Secondary | ICD-10-CM | POA: Insufficient documentation

## 2015-08-26 DIAGNOSIS — K529 Noninfective gastroenteritis and colitis, unspecified: Secondary | ICD-10-CM | POA: Insufficient documentation

## 2015-08-26 DIAGNOSIS — E860 Dehydration: Secondary | ICD-10-CM | POA: Insufficient documentation

## 2015-08-26 LAB — COMPREHENSIVE METABOLIC PANEL
ALBUMIN: 3.1 g/dL — AB (ref 3.5–5.0)
ALT: 13 U/L — AB (ref 14–54)
ANION GAP: 11 (ref 5–15)
AST: 20 U/L (ref 15–41)
Alkaline Phosphatase: 88 U/L (ref 38–126)
BUN: 6 mg/dL (ref 6–20)
CHLORIDE: 101 mmol/L (ref 101–111)
CO2: 21 mmol/L — AB (ref 22–32)
Calcium: 8.2 mg/dL — ABNORMAL LOW (ref 8.9–10.3)
Creatinine, Ser: 0.52 mg/dL (ref 0.44–1.00)
GFR calc non Af Amer: >60 mL/min (ref >60)
Glucose, Bld: 81 mg/dL (ref 65–99)
Potassium: 3.6 mmol/L (ref 3.5–5.1)
SODIUM: 133 mmol/L — AB (ref 135–145)
Total Bilirubin: 0.6 mg/dL (ref 0.3–1.2)
Total Protein: 7.1 g/dL (ref 6.5–8.1)

## 2015-08-26 LAB — CBC
HCT: 34.4 % — ABNORMAL LOW (ref 36.0–46.0)
Hemoglobin: 11.5 g/dL — ABNORMAL LOW (ref 12.0–15.0)
MCH: 28.7 pg (ref 26.0–34.0)
MCHC: 33.4 g/dL (ref 30.0–36.0)
MCV: 85.8 fL (ref 78.0–100.0)
PLATELETS: 194 10*3/uL (ref 150–400)
RBC: 4.01 MIL/uL (ref 3.87–5.11)
RDW: 14.4 % (ref 11.5–15.5)
WBC: 7.6 10*3/uL (ref 4.0–10.5)

## 2015-08-26 LAB — URINALYSIS, ROUTINE W REFLEX MICROSCOPIC
BILIRUBIN URINE: NEGATIVE
Glucose, UA: NEGATIVE mg/dL
Hgb urine dipstick: NEGATIVE
Ketones, ur: >80 mg/dL — AB
LEUKOCYTES UA: NEGATIVE
NITRITE: NEGATIVE
PH: 6.5 (ref 5.0–8.0)
Protein, ur: NEGATIVE mg/dL
SPECIFIC GRAVITY, URINE: 1.02 (ref 1.005–1.030)

## 2015-08-26 MED ORDER — SODIUM CHLORIDE 0.9 % IV SOLN
Freq: Once | INTRAVENOUS | Status: AC
  Administered 2015-08-26: 18:00:00 via INTRAVENOUS

## 2015-08-26 MED ORDER — POTASSIUM CHLORIDE IN NACL 20-0.9 MEQ/L-% IV SOLN: INTRAVENOUS | Status: DC

## 2015-08-26 MED ORDER — SODIUM CHLORIDE 0.9 % IV SOLN
8.0000 mg | Freq: Once | INTRAVENOUS | Status: AC
  Administered 2015-08-26: 8 mg via INTRAVENOUS
  Filled 2015-08-26: qty 4

## 2015-08-26 MED ORDER — FAMOTIDINE IN NACL 20-0.9 MG/50ML-% IV SOLN
20.0000 mg | Freq: Once | INTRAVENOUS | Status: AC
  Administered 2015-08-26: 20 mg via INTRAVENOUS
  Filled 2015-08-26: qty 50

## 2015-08-26 MED ORDER — ACETAMINOPHEN 10 MG/ML IV SOLN: 1000.0000 mg | Freq: Once | INTRAVENOUS | Status: DC

## 2015-08-26 MED ORDER — ACETAMINOPHEN 500 MG PO TABS
1000.0000 mg | ORAL_TABLET | Freq: Once | ORAL | Status: AC
  Administered 2015-08-26: 1000 mg via ORAL
  Filled 2015-08-26: qty 2

## 2015-08-26 MED ORDER — SODIUM CHLORIDE 0.9 % IV SOLN
Freq: Once | INTRAVENOUS | Status: AC
  Administered 2015-08-26: 20:00:00 via INTRAVENOUS

## 2015-08-26 MED ORDER — SODIUM CHLORIDE 0.9 % IV SOLN
INTRAVENOUS | Status: DC
  Administered 2015-08-26: 22:00:00 via INTRAVENOUS
  Filled 2015-08-26 (×5): qty 1000

## 2015-08-26 NOTE — MAU Note (Signed)
Pt unable to void at this time, was very dizzy when went to get up.  Pt placed in wheelchair, near nurses station, waiting on rm to be clearned.  Warm blanket given.

## 2015-08-26 NOTE — MAU Note (Addendum)
Started at 0100 vomiting, fever (highest 102.2) and chills. Fell around 0200, got dizzy.no diarrhea

## 2015-08-26 NOTE — H&P (Signed)
Chief complaint: Emesis, weakness, fever  History of present illness: 25 year old G2 P1 at 28 weeks and 4 days gestation presents with one days worth of nausea vomiting fever and weakness. Patient was seen in the office yesterday for routine OB visit and glucose screening. At that visit she reported she was finally well after struggling with confirmed fluid diagnosis 2 weeks prior. Patient had completed a course of Tamiflu and over the last week was feeling well. However yesterday after dinner she started noticing some nausea without significant abdominal pain. Patient awoke last night at 1 AM with significant emesis. Despite Zantac and Zofran patient continued with emesis through the day. By midday she was feeling weak and dizzy with low-grade fevers. Denies significant abdominal pain. Patient does have a history of a prior appendectomy. Patient notes good fetal movement, no leakage of fluid, no vaginal bleeding and no contractions  Past Medical History  Diagnosis Date  . Thyroid disorder   . Ovarian cyst   . Abnormal Pap smear     Past Surgical History  Procedure Laterality Date  . Appendectomy    . Cesarean section    . Cyst removal from left ovary      Physical exam Filed Vitals:   08/26/15 1632  BP: 114/60  Pulse: 130  Temp: 100.4 F (38 C)  TempSrc: Oral  Resp: 18  SpO2: 100%   Gen.: Appears ill but no acute distress. Fatigued Cardiovascular: Tachycardic, normal rhythm Pulmonary: Clear to auscultation bilaterally Abdomen: No right upper quadrant pain, no rebound, no guarding, mild discomfort in right lower quadrant. No fundal tenderness GU: Deferred Lower extremity: Nontender, no edema  Toco: None NST: Initially 160s with accelerations no decelerations and 10 beat variability after IV fluids baseline down to 130s positive accelerations no decelerations 10 beat variability  MAU course: Patient received 3 L of IV fluids. In her last liter she had 20 mEq of KCl. Patient had a  normal CBC and a normal CMP after 3 L of IV fluid she still had ketones in her urine and a high specific gravity but overall was feeling better she did have a headache that was treated with Tylenol. After the 3 L she was feeling better and was able to tolerate small amount of oral food  Assessment and plan: Gastroenteritis and dehydration at 28 weeks of pregnancy. Improved with IV fluids  Reactive fetal testing Continue oral hydration at home with antiemetics as needed Follow-up routine  Altagracia Rone A. 08/27/2015 5:02 PM

## 2015-08-26 NOTE — Progress Notes (Signed)
Notified of pt UA result and comfort level. Will discharge home after fluid is finished

## 2015-09-21 ENCOUNTER — Other Ambulatory Visit: Payer: Self-pay | Admitting: Obstetrics and Gynecology

## 2015-10-27 ENCOUNTER — Telehealth (HOSPITAL_COMMUNITY): Payer: Self-pay | Admitting: *Deleted

## 2015-10-27 ENCOUNTER — Encounter (HOSPITAL_COMMUNITY): Payer: Self-pay

## 2015-10-27 NOTE — Telephone Encounter (Signed)
Preadmission screen  

## 2015-10-28 ENCOUNTER — Telehealth (HOSPITAL_COMMUNITY): Payer: Self-pay | Admitting: *Deleted

## 2015-10-28 NOTE — Telephone Encounter (Signed)
Preadmission screen  

## 2015-10-29 ENCOUNTER — Encounter (HOSPITAL_COMMUNITY): Payer: Self-pay

## 2015-11-01 ENCOUNTER — Encounter (HOSPITAL_COMMUNITY)
Admission: AD | Disposition: A | Discharge status: Home / Self Care | Payer: Self-pay | Source: Ambulatory Visit | Attending: Obstetrics & Gynecology

## 2015-11-01 ENCOUNTER — Inpatient Hospital Stay (HOSPITAL_COMMUNITY): Admitting: Anesthesiology

## 2015-11-01 ENCOUNTER — Inpatient Hospital Stay (HOSPITAL_COMMUNITY)
Admission: AD | Admit: 2015-11-01 | Discharge: 2015-11-03 | DRG: 765 | Disposition: A | Discharge status: Home / Self Care | Source: Ambulatory Visit | Attending: Obstetrics & Gynecology | Admitting: Obstetrics & Gynecology

## 2015-11-01 ENCOUNTER — Encounter (HOSPITAL_COMMUNITY): Payer: Self-pay | Admitting: Anesthesiology

## 2015-11-01 DIAGNOSIS — Z825 Family history of asthma and other chronic lower respiratory diseases: Secondary | ICD-10-CM | POA: Diagnosis not present

## 2015-11-01 DIAGNOSIS — Z8261 Family history of arthritis: Secondary | ICD-10-CM | POA: Diagnosis not present

## 2015-11-01 DIAGNOSIS — E039 Hypothyroidism, unspecified: Secondary | ICD-10-CM | POA: Diagnosis present

## 2015-11-01 DIAGNOSIS — O9962 Diseases of the digestive system complicating childbirth: Secondary | ICD-10-CM | POA: Diagnosis present

## 2015-11-01 DIAGNOSIS — O3663X0 Maternal care for excessive fetal growth, third trimester, not applicable or unspecified: Secondary | ICD-10-CM | POA: Diagnosis present

## 2015-11-01 DIAGNOSIS — Z833 Family history of diabetes mellitus: Secondary | ICD-10-CM

## 2015-11-01 DIAGNOSIS — O9081 Anemia of the puerperium: Secondary | ICD-10-CM | POA: Diagnosis not present

## 2015-11-01 DIAGNOSIS — D62 Acute posthemorrhagic anemia: Secondary | ICD-10-CM | POA: Diagnosis not present

## 2015-11-01 DIAGNOSIS — K219 Gastro-esophageal reflux disease without esophagitis: Secondary | ICD-10-CM | POA: Diagnosis present

## 2015-11-01 DIAGNOSIS — Z8249 Family history of ischemic heart disease and other diseases of the circulatory system: Secondary | ICD-10-CM | POA: Diagnosis not present

## 2015-11-01 DIAGNOSIS — O99284 Endocrine, nutritional and metabolic diseases complicating childbirth: Secondary | ICD-10-CM | POA: Diagnosis present

## 2015-11-01 DIAGNOSIS — D509 Iron deficiency anemia, unspecified: Secondary | ICD-10-CM | POA: Diagnosis present

## 2015-11-01 DIAGNOSIS — O99280 Endocrine, nutritional and metabolic diseases complicating pregnancy, unspecified trimester: Secondary | ICD-10-CM

## 2015-11-01 DIAGNOSIS — Z811 Family history of alcohol abuse and dependence: Secondary | ICD-10-CM | POA: Diagnosis not present

## 2015-11-01 DIAGNOSIS — Z3A38 38 weeks gestation of pregnancy: Secondary | ICD-10-CM | POA: Diagnosis not present

## 2015-11-01 DIAGNOSIS — IMO0001 Reserved for inherently not codable concepts without codable children: Secondary | ICD-10-CM | POA: Diagnosis present

## 2015-11-01 DIAGNOSIS — O34211 Maternal care for low transverse scar from previous cesarean delivery: Principal | ICD-10-CM | POA: Diagnosis present

## 2015-11-01 DIAGNOSIS — Z98891 History of uterine scar from previous surgery: Secondary | ICD-10-CM

## 2015-11-01 HISTORY — DX: Acute posthemorrhagic anemia: D62

## 2015-11-01 HISTORY — DX: Reserved for inherently not codable concepts without codable children: IMO0001

## 2015-11-01 LAB — URINALYSIS, ROUTINE W REFLEX MICROSCOPIC
BILIRUBIN URINE: NEGATIVE
Glucose, UA: NEGATIVE mg/dL
Hgb urine dipstick: NEGATIVE
KETONES UR: 15 mg/dL — AB
LEUKOCYTES UA: NEGATIVE
NITRITE: NEGATIVE
PROTEIN: NEGATIVE mg/dL
Specific Gravity, Urine: 1.015 (ref 1.005–1.030)
pH: 6 (ref 5.0–8.0)

## 2015-11-01 LAB — CBC
HEMATOCRIT: 30.2 % — AB (ref 36.0–46.0)
HEMATOCRIT: 28 % — AB (ref 36.0–46.0)
HEMOGLOBIN: 9.9 g/dL — AB (ref 12.0–15.0)
HEMOGLOBIN: 9 g/dL — AB (ref 12.0–15.0)
MCH: 26.8 pg (ref 26.0–34.0)
MCH: 26.5 pg (ref 26.0–34.0)
MCHC: 32.8 g/dL (ref 30.0–36.0)
MCHC: 32.1 g/dL (ref 30.0–36.0)
MCV: 81.6 fL (ref 78.0–100.0)
MCV: 82.4 fL (ref 78.0–100.0)
Platelets: 273 10*3/uL (ref 150–400)
Platelets: 243 10*3/uL (ref 150–400)
RBC: 3.7 MIL/uL — AB (ref 3.87–5.11)
RBC: 3.4 MIL/uL — ABNORMAL LOW (ref 3.87–5.11)
RDW: 15.8 % — ABNORMAL HIGH (ref 11.5–15.5)
RDW: 15.9 % — AB (ref 11.5–15.5)
WBC: 17.4 10*3/uL — ABNORMAL HIGH (ref 4.0–10.5)
WBC: 17.2 10*3/uL — AB (ref 4.0–10.5)

## 2015-11-01 LAB — ABO/RH: ABO/RH(D): O POS

## 2015-11-01 LAB — TYPE AND SCREEN
ABO/RH(D): O POS
Antibody Screen: NEGATIVE

## 2015-11-01 SURGERY — Surgical Case
Anesthesia: Spinal | Site: Abdomen | Wound class: Clean Contaminated

## 2015-11-01 MED ORDER — NALBUPHINE HCL 10 MG/ML IJ SOLN: 5.0000 mg | INTRAMUSCULAR | Status: DC | PRN

## 2015-11-01 MED ORDER — MEPERIDINE HCL 25 MG/ML IJ SOLN
INTRAMUSCULAR | Status: AC
  Filled 2015-11-01: qty 1

## 2015-11-01 MED ORDER — SODIUM CHLORIDE 0.9% FLUSH: 3.0000 mL | INTRAVENOUS | Status: DC | PRN

## 2015-11-01 MED ORDER — LACTATED RINGERS IV SOLN
INTRAVENOUS | Status: DC | PRN
Start: 2015-11-01 — End: 2015-11-01
  Administered 2015-11-01 (×3): via INTRAVENOUS

## 2015-11-01 MED ORDER — SCOPOLAMINE 1 MG/3DAYS TD PT72
MEDICATED_PATCH | TRANSDERMAL | Status: DC | PRN
  Administered 2015-11-01: 1 via TRANSDERMAL

## 2015-11-01 MED ORDER — ACETAMINOPHEN 325 MG PO TABS
650.0000 mg | ORAL_TABLET | ORAL | Status: DC | PRN
  Administered 2015-11-02: 650 mg via ORAL
  Filled 2015-11-01: qty 2

## 2015-11-01 MED ORDER — SODIUM CHLORIDE 0.9 % IR SOLN
Status: DC | PRN
  Administered 2015-11-01: 1000 mL

## 2015-11-01 MED ORDER — ALBUTEROL SULFATE (2.5 MG/3ML) 0.083% IN NEBU: 3.0000 mL | INHALATION_SOLUTION | Freq: Four times a day (QID) | RESPIRATORY_TRACT | Status: DC | PRN

## 2015-11-01 MED ORDER — MORPHINE SULFATE (PF) 0.5 MG/ML IJ SOLN
INTRAMUSCULAR | Status: DC | PRN
  Administered 2015-11-01: .2 mg via INTRAVENOUS

## 2015-11-01 MED ORDER — FENTANYL CITRATE (PF) 100 MCG/2ML IJ SOLN: 25.0000 ug | INTRAMUSCULAR | Status: DC | PRN

## 2015-11-01 MED ORDER — DIPHENHYDRAMINE HCL 50 MG/ML IJ SOLN: 12.5000 mg | INTRAMUSCULAR | Status: DC | PRN

## 2015-11-01 MED ORDER — DIPHENHYDRAMINE HCL 25 MG PO CAPS
25.0000 mg | ORAL_CAPSULE | ORAL | Status: DC | PRN
  Administered 2015-11-02: 25 mg via ORAL
  Filled 2015-11-01 (×2): qty 1

## 2015-11-01 MED ORDER — ONDANSETRON HCL 4 MG/2ML IJ SOLN
INTRAMUSCULAR | Status: DC | PRN
  Administered 2015-11-01: 4 mg via INTRAVENOUS

## 2015-11-01 MED ORDER — MEPERIDINE HCL 25 MG/ML IJ SOLN
INTRAMUSCULAR | Status: DC | PRN
  Administered 2015-11-01 (×2): 12.5 mg via INTRAVENOUS

## 2015-11-01 MED ORDER — SENNOSIDES-DOCUSATE SODIUM 8.6-50 MG PO TABS
2.0000 | ORAL_TABLET | ORAL | Status: DC
  Administered 2015-11-02 (×2): 2 via ORAL
  Filled 2015-11-01 (×2): qty 2

## 2015-11-01 MED ORDER — GENTAMICIN SULFATE 40 MG/ML IJ SOLN
INTRAMUSCULAR | Status: AC
  Administered 2015-11-01: 30 mL via INTRAVENOUS
  Filled 2015-11-01: qty 9

## 2015-11-01 MED ORDER — ZOLPIDEM TARTRATE 5 MG PO TABS: 5.0000 mg | ORAL_TABLET | Freq: Every evening | ORAL | Status: DC | PRN

## 2015-11-01 MED ORDER — AZITHROMYCIN 250 MG PO TABS
250.0000 mg | ORAL_TABLET | Freq: Every day | ORAL | Status: AC
  Administered 2015-11-01 – 2015-11-03 (×3): 250 mg via ORAL
  Filled 2015-11-01 (×3): qty 1

## 2015-11-01 MED ORDER — PHENYLEPHRINE 8 MG IN D5W 100 ML (0.08MG/ML) PREMIX OPTIME
INJECTION | INTRAVENOUS | Status: DC | PRN
  Administered 2015-11-01: 60 ug/min via INTRAVENOUS

## 2015-11-01 MED ORDER — LACTATED RINGERS IV SOLN: INTRAVENOUS | Status: DC

## 2015-11-01 MED ORDER — BUPIVACAINE HCL (PF) 0.75 % IJ SOLN
INTRAMUSCULAR | Status: DC | PRN
  Administered 2015-11-01: 1.5 mL via INTRATHECAL

## 2015-11-01 MED ORDER — ALBUTEROL SULFATE (2.5 MG/3ML) 0.083% IN NEBU: 2.5000 mg | INHALATION_SOLUTION | Freq: Four times a day (QID) | RESPIRATORY_TRACT | Status: DC | PRN

## 2015-11-01 MED ORDER — LACTATED RINGERS IV SOLN
INTRAVENOUS | Status: DC | PRN
Start: 2015-11-01 — End: 2015-11-01
  Administered 2015-11-01: 10:00:00 via INTRAVENOUS

## 2015-11-01 MED ORDER — MEPERIDINE HCL 25 MG/ML IJ SOLN: 6.2500 mg | INTRAMUSCULAR | Status: DC | PRN

## 2015-11-01 MED ORDER — NALBUPHINE HCL 10 MG/ML IJ SOLN: 5.0000 mg | Freq: Once | INTRAMUSCULAR | Status: DC | PRN

## 2015-11-01 MED ORDER — LACTATED RINGERS IV SOLN
INTRAVENOUS | Status: DC
  Administered 2015-11-01: 999 mL via INTRAVENOUS

## 2015-11-01 MED ORDER — TETANUS-DIPHTH-ACELL PERTUSSIS 5-2.5-18.5 LF-MCG/0.5 IM SUSP: 0.5000 mL | Freq: Once | INTRAMUSCULAR | Status: DC

## 2015-11-01 MED ORDER — PRENATAL MULTIVITAMIN CH
1.0000 | ORAL_TABLET | Freq: Every day | ORAL | Status: DC
Start: 2015-11-02 — End: 2015-11-03
  Administered 2015-11-02 – 2015-11-03 (×2): 1 via ORAL
  Filled 2015-11-01 (×2): qty 1

## 2015-11-01 MED ORDER — WITCH HAZEL-GLYCERIN EX PADS
1.0000 | MEDICATED_PAD | CUTANEOUS | Status: DC | PRN
Start: 2015-11-01 — End: 2015-11-03

## 2015-11-01 MED ORDER — OXYTOCIN 10 UNIT/ML IJ SOLN
INTRAMUSCULAR | Status: AC
  Filled 2015-11-01: qty 4

## 2015-11-01 MED ORDER — DIBUCAINE 1 % RE OINT: 1.0000 "application " | TOPICAL_OINTMENT | RECTAL | Status: DC | PRN

## 2015-11-01 MED ORDER — SCOPOLAMINE 1 MG/3DAYS TD PT72
MEDICATED_PATCH | TRANSDERMAL | Status: AC
  Filled 2015-11-01: qty 1

## 2015-11-01 MED ORDER — OXYTOCIN 10 UNIT/ML IJ SOLN
40.0000 [IU] | INTRAVENOUS | Status: DC | PRN
  Administered 2015-11-01: 40 [IU] via INTRAVENOUS

## 2015-11-01 MED ORDER — ONDANSETRON HCL 4 MG/2ML IJ SOLN
INTRAMUSCULAR | Status: AC
  Filled 2015-11-01: qty 2

## 2015-11-01 MED ORDER — FENTANYL CITRATE (PF) 100 MCG/2ML IJ SOLN
INTRAMUSCULAR | Status: DC | PRN
  Administered 2015-11-01: 15 ug via INTRATHECAL

## 2015-11-01 MED ORDER — MENTHOL 3 MG MT LOZG: 1.0000 | LOZENGE | OROMUCOSAL | Status: DC | PRN

## 2015-11-01 MED ORDER — IBUPROFEN 600 MG PO TABS
600.0000 mg | ORAL_TABLET | Freq: Four times a day (QID) | ORAL | Status: DC
  Administered 2015-11-01 – 2015-11-03 (×8): 600 mg via ORAL
  Filled 2015-11-01 (×8): qty 1

## 2015-11-01 MED ORDER — SIMETHICONE 80 MG PO CHEW
80.0000 mg | CHEWABLE_TABLET | ORAL | Status: DC
Start: 2015-11-02 — End: 2015-11-03
  Administered 2015-11-02 (×2): 80 mg via ORAL
  Filled 2015-11-01 (×2): qty 1

## 2015-11-01 MED ORDER — ACETAMINOPHEN 325 MG PO TABS: 650.0000 mg | ORAL_TABLET | Freq: Once | ORAL | Status: AC

## 2015-11-01 MED ORDER — SOD CITRATE-CITRIC ACID 500-334 MG/5ML PO SOLN
30.0000 mL | Freq: Once | ORAL | Status: AC
  Administered 2015-11-01: 30 mL via ORAL
  Filled 2015-11-01: qty 15

## 2015-11-01 MED ORDER — SCOPOLAMINE 1 MG/3DAYS TD PT72: 1.0000 | MEDICATED_PATCH | Freq: Once | TRANSDERMAL | Status: DC

## 2015-11-01 MED ORDER — PROMETHAZINE HCL 25 MG/ML IJ SOLN
INTRAMUSCULAR | Status: AC
  Administered 2015-11-01: 12.5 mg via INTRAVENOUS
  Filled 2015-11-01: qty 1

## 2015-11-01 MED ORDER — OXYCODONE-ACETAMINOPHEN 5-325 MG PO TABS
1.0000 | ORAL_TABLET | ORAL | Status: DC | PRN
  Administered 2015-11-02 – 2015-11-03 (×5): 1 via ORAL
  Filled 2015-11-01 (×5): qty 1

## 2015-11-01 MED ORDER — PRENATAL MULTIVITAMIN CH: 1.0000 | ORAL_TABLET | Freq: Every day | ORAL | Status: DC

## 2015-11-01 MED ORDER — KETOROLAC TROMETHAMINE 30 MG/ML IJ SOLN
INTRAMUSCULAR | Status: AC
  Administered 2015-11-01: 30 mg via INTRAMUSCULAR
  Filled 2015-11-01: qty 1

## 2015-11-01 MED ORDER — OXYTOCIN 40 UNITS IN LACTATED RINGERS INFUSION - SIMPLE MED: 2.5000 [IU]/h | INTRAVENOUS | Status: AC

## 2015-11-01 MED ORDER — PHENYLEPHRINE 8 MG IN D5W 100 ML (0.08MG/ML) PREMIX OPTIME
INJECTION | INTRAVENOUS | Status: AC
  Filled 2015-11-01: qty 100

## 2015-11-01 MED ORDER — MORPHINE SULFATE (PF) 0.5 MG/ML IJ SOLN
INTRAMUSCULAR | Status: AC
  Filled 2015-11-01: qty 10

## 2015-11-01 MED ORDER — ACETAMINOPHEN 160 MG/5ML PO SOLN
ORAL | Status: AC
  Administered 2015-11-01: 650 mg
  Filled 2015-11-01: qty 20.3

## 2015-11-01 MED ORDER — NALOXONE HCL 0.4 MG/ML IJ SOLN: 0.4000 mg | INTRAMUSCULAR | Status: DC | PRN

## 2015-11-01 MED ORDER — LEVOTHYROXINE SODIUM 200 MCG PO TABS
200.0000 ug | ORAL_TABLET | Freq: Every day | ORAL | Status: DC
  Administered 2015-11-02 – 2015-11-03 (×2): 200 ug via ORAL
  Filled 2015-11-01 (×3): qty 1

## 2015-11-01 MED ORDER — DIPHENHYDRAMINE HCL 25 MG PO CAPS: 25.0000 mg | ORAL_CAPSULE | Freq: Four times a day (QID) | ORAL | Status: DC | PRN

## 2015-11-01 MED ORDER — DEXAMETHASONE SODIUM PHOSPHATE 10 MG/ML IJ SOLN
INTRAMUSCULAR | Status: DC | PRN
  Administered 2015-11-01: 10 mg via INTRAVENOUS

## 2015-11-01 MED ORDER — NALOXONE HCL 2 MG/2ML IJ SOSY
1.0000 ug/kg/h | PREFILLED_SYRINGE | INTRAVENOUS | Status: DC | PRN
  Filled 2015-11-01: qty 2

## 2015-11-01 MED ORDER — FENTANYL CITRATE (PF) 100 MCG/2ML IJ SOLN
INTRAMUSCULAR | Status: AC
  Filled 2015-11-01: qty 2

## 2015-11-01 MED ORDER — ONDANSETRON HCL 4 MG/2ML IJ SOLN: 4.0000 mg | Freq: Three times a day (TID) | INTRAMUSCULAR | Status: DC | PRN

## 2015-11-01 MED ORDER — KETOROLAC TROMETHAMINE 30 MG/ML IJ SOLN: 30.0000 mg | Freq: Four times a day (QID) | INTRAMUSCULAR | Status: AC | PRN

## 2015-11-01 MED ORDER — SIMETHICONE 80 MG PO CHEW: 80.0000 mg | CHEWABLE_TABLET | ORAL | Status: DC | PRN

## 2015-11-01 MED ORDER — KETOROLAC TROMETHAMINE 30 MG/ML IJ SOLN
30.0000 mg | Freq: Four times a day (QID) | INTRAMUSCULAR | Status: AC | PRN
  Administered 2015-11-01: 30 mg via INTRAMUSCULAR

## 2015-11-01 MED ORDER — LACTATED RINGERS IV BOLUS (SEPSIS)
500.0000 mL | Freq: Once | INTRAVENOUS | Status: AC
  Administered 2015-11-01: 07:00:00 via INTRAVENOUS

## 2015-11-01 MED ORDER — PROMETHAZINE HCL 25 MG/ML IJ SOLN
12.5000 mg | INTRAMUSCULAR | Status: DC | PRN
  Administered 2015-11-01: 12.5 mg via INTRAVENOUS

## 2015-11-01 MED ORDER — SIMETHICONE 80 MG PO CHEW
80.0000 mg | CHEWABLE_TABLET | Freq: Three times a day (TID) | ORAL | Status: DC
  Administered 2015-11-01 – 2015-11-03 (×5): 80 mg via ORAL
  Filled 2015-11-01 (×5): qty 1

## 2015-11-01 MED ORDER — FAMOTIDINE IN NACL 20-0.9 MG/50ML-% IV SOLN
20.0000 mg | Freq: Once | INTRAVENOUS | Status: AC
  Administered 2015-11-01: 20 mg via INTRAVENOUS
  Filled 2015-11-01: qty 50

## 2015-11-01 MED ORDER — COCONUT OIL OIL
1.0000 "application " | TOPICAL_OIL | Status: DC | PRN
  Administered 2015-11-03: 1 via TOPICAL
  Filled 2015-11-01: qty 120

## 2015-11-01 SURGICAL SUPPLY — 32 items
CLAMP CORD UMBIL (MISCELLANEOUS) IMPLANT
CLOTH BEACON ORANGE TIMEOUT ST (SAFETY) ×3 IMPLANT
CONTAINER PREFILL 10% NBF 15ML (MISCELLANEOUS) IMPLANT
DRSG OPSITE POSTOP 4X10 (GAUZE/BANDAGES/DRESSINGS) ×3 IMPLANT
DURAPREP 26ML APPLICATOR (WOUND CARE) ×3 IMPLANT
ELECT REM PT RETURN 9FT ADLT (ELECTROSURGICAL) ×3
ELECTRODE REM PT RTRN 9FT ADLT (ELECTROSURGICAL) ×1 IMPLANT
EXTRACTOR VACUUM KIWI (MISCELLANEOUS) IMPLANT
EXTRACTOR VACUUM M CUP 4 TUBE (SUCTIONS) IMPLANT
EXTRACTOR VACUUM M CUP 4' TUBE (SUCTIONS)
GLOVE BIO SURGEON STRL SZ7 (GLOVE) ×3 IMPLANT
GLOVE BIOGEL PI IND STRL 7.0 (GLOVE) ×2 IMPLANT
GLOVE BIOGEL PI INDICATOR 7.0 (GLOVE) ×4
GOWN STRL REUS W/TWL LRG LVL3 (GOWN DISPOSABLE) ×6 IMPLANT
KIT ABG SYR 3ML LUER SLIP (SYRINGE) IMPLANT
NEEDLE HYPO 25X5/8 SAFETYGLIDE (NEEDLE) IMPLANT
NS IRRIG 1000ML POUR BTL (IV SOLUTION) ×3 IMPLANT
PACK C SECTION WH (CUSTOM PROCEDURE TRAY) ×3 IMPLANT
PAD OB MATERNITY 4.3X12.25 (PERSONAL CARE ITEMS) ×3 IMPLANT
RTRCTR C-SECT PINK 25CM LRG (MISCELLANEOUS) IMPLANT
SUT MON AB-0 CT1 36 (SUTURE) ×9 IMPLANT
SUT PLAIN 0 NONE (SUTURE) IMPLANT
SUT PLAIN 2 0 (SUTURE) ×2
SUT PLAIN ABS 2-0 CT1 27XMFL (SUTURE) ×1 IMPLANT
SUT VIC AB 0 CT1 27 (SUTURE) ×4
SUT VIC AB 0 CT1 27XBRD ANBCTR (SUTURE) ×2 IMPLANT
SUT VIC AB 2-0 CT1 27 (SUTURE) ×6
SUT VIC AB 2-0 CT1 TAPERPNT 27 (SUTURE) ×3 IMPLANT
SUT VIC AB 4-0 KS 27 (SUTURE) ×3 IMPLANT
SUT VICRYL 0 TIES 12 18 (SUTURE) IMPLANT
TOWEL OR 17X24 6PK STRL BLUE (TOWEL DISPOSABLE) ×3 IMPLANT
TRAY FOLEY CATH SILVER 14FR (SET/KITS/TRAYS/PACK) IMPLANT

## 2015-11-01 NOTE — Anesthesia Preprocedure Evaluation (Signed)
Anesthesia Evaluation  Patient identified by MRN, date of birth, ID band Patient awake    Reviewed: Allergy & Precautions, H&P , NPO status , Patient's Chart, lab work & pertinent test results  History of Anesthesia Complications (+) PONV  Airway Mallampati: II  TM Distance: >3 FB Neck ROM: full    Dental no notable dental hx. (+) Dental Advisory Given, Teeth Intact   Pulmonary neg pulmonary ROS,    Pulmonary exam normal breath sounds clear to auscultation       Cardiovascular Exercise Tolerance: Good negative cardio ROS Normal cardiovascular exam Rhythm:regular Rate:Normal     Neuro/Psych negative neurological ROS  negative psych ROS   GI/Hepatic negative GI ROS, Neg liver ROS, GERD  ,  Endo/Other  negative endocrine ROSHypothyroidism   Renal/GU negative Renal ROS  negative genitourinary   Musculoskeletal   Abdominal   Peds  Hematology negative hematology ROS (+)   Anesthesia Other Findings   Reproductive/Obstetrics negative OB ROS                             Anesthesia Physical Anesthesia Plan  ASA: II  Anesthesia Plan: Spinal   Post-op Pain Management:    Induction:   Airway Management Planned:   Additional Equipment:   Intra-op Plan:   Post-operative Plan:   Informed Consent: I have reviewed the patients History and Physical, chart, labs and discussed the procedure including the risks, benefits and alternatives for the proposed anesthesia with the patient or authorized representative who has indicated his/her understanding and acceptance.   Dental Advisory Given  Plan Discussed with: CRNA and Surgeon  Anesthesia Plan Comments:         Anesthesia Quick Evaluation

## 2015-11-01 NOTE — H&P (Addendum)
Jane Ayers is a 25 y.o. female presenting at 38.1 wks with UCs since 2 am, no leaking fluid or bleeding. Good FMs. Scheduled RC/s at 39 wks next wk. Upon arrival to MAU, pt reported UCs spacing out. But she was tachycardic in 130-140s, UA noted ketones, so was given fluid bolus. FHT- category I. UCs picked up again, regular 3 min, getting much stronger and patient visually uncomfortable with them. Minimal cervical change since office exam but will proceed with C/s since early labor.  PNCare- Hypothyroidism, needing dose adjustment frequently. Good growth, LGA.                  GERD              URI/ sinus infection recent  Prior C/s - at 41 wks, IOL, 9 cm arrest of dilatation 6.1/2 lb baby. Uncomplicated recovery but possible reaction to steristrips- welts.   History OB History    Gravida Para Term Preterm AB TAB SAB Ectopic Multiple Living   2 1 1       1      Past Medical History  Diagnosis Date  . Thyroid disorder   . Ovarian cyst   . Abnormal Pap smear   . GERD (gastroesophageal reflux disease)   . Hypothyroidism   . Vaginal Pap smear, abnormal   . Complication of anesthesia     aggressive after anesthesia, severe vomitting after CS   Past Surgical History  Procedure Laterality Date  . Appendectomy    . Cesarean section    . Cyst removal from left ovary    . Mouth surgery     Family History: family history includes Alcohol abuse in her father; Arthritis in her father; Asthma in her mother and sister; Diabetes in her father and maternal grandmother; Hyperlipidemia in her maternal aunt, maternal grandfather, maternal uncle, and mother; Hypertension in her maternal aunt, maternal grandfather, maternal uncle, and mother. There is no history of Other. Social History:  reports that she has never smoked. She has never used smokeless tobacco. She reports that she does not drink alcohol or use illicit drugs.   Prenatal Transfer Tool  Maternal Diabetes: No Genetic Screening:  Normal Maternal Ultrasounds/Referrals: Normal Fetal Ultrasounds or other Referrals:  None Maternal Substance Abuse:  No Significant Maternal Medications:  Meds include: Zantac Syntroid Protonix Significant Maternal Lab Results:  Lab values include: Group B Strep negative Other Comments:  None LGA infant. Synthroid increased several times in preg, last dose 200mcg  ROS URI s/s   Dilation:  (ext os 2cm and internal os closed) Effacement (%): 50 Station: -3 Exam by:: Quintella BatonJo BArham rNC Blood pressure 117/80, pulse 107, temperature 98.3 F (36.8 C), resp. rate 20, height 5\' 4"  (1.626 m), weight 213 lb 3.2 oz (96.707 kg), SpO2 99 %. Exam Physical Exam    A&O x 3, no acute distress. Pleasant HEENT neg, no thyromegaly Lungs CTA bilat CV Regualr but tachycardia, improved with fluid bolud, S1S2 normal Abdo soft, non tender, non acute, contractions moderate strong. Non tender uterus and abdomen Extr no edema/ tenderness Pelvic above FHT  150s/ category I Toco regular q 2-3 minutes  Prenatal labs: ABO, Rh:  O(+) Antibody:  neg Rubella:  Imm RPR:   NR HBsAg:   Neg HIV:   Neg GBS:   neg GLucola normal AFP4 normal   Assessment/Plan: 25 yo G2P1, 38.1 wks, active labor. Proceed with RCs Risks/complications of surgery reviewed incl infection, bleeding, damage to internal organs including  bladder, bowels, ureters, blood vessels, other risks from anesthesia, VTE and delayed complications of any surgery, complications in future surgery reviewed. Also discussed neonatal complications incl difficult delivery, laceration, vacuum assistance, TTN etc. Pt understands and agrees, all concerns addressed.       Archana Eckman R 11/01/2015, 9:46 AM

## 2015-11-01 NOTE — Consult Note (Signed)
[facility]  Delivery Note:  C-section       11/01/2015  10:10 AM  I was called to the operating room at the request of the patient's obstetrician (Dr. Mody) for a repeat c-section.  PRENATAL HX:  This is a 24 y/o G2P1001 at 38 and 1/[redacted] weeks gestation who was planning a repeat c-section but presented in labor today.  Her pregnancy has been complicated by hypothyroidism.  INTRAPARTUM HX:   Repeat c-section with AROM at delivery  DELIVERY:  Infant was vigorous at delivery, requiring no resuscitation other than standard warming, drying and stimulation.  APGARs 8 and 9.  Exam within normal limits.  After 5 minutes, baby left with nurse to assist parents with skin-to-skin care.   _____________________ Electronically Signed By: Aiden Rao, MD Neonatologist  
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
## 2015-11-01 NOTE — Progress Notes (Signed)
Pt states ctxs not as frequent as were at home earlier

## 2015-11-01 NOTE — MAU Note (Addendum)
Contractions since 0200. Denies LOF or bleeding. For repeat C/S 6/10. Currently being treated for sinus infection

## 2015-11-01 NOTE — Anesthesia Procedure Notes (Signed)
Spinal Patient location during procedure: OB Start time: 11/01/2015 9:37 AM End time: 11/01/2015 9:42 AM Staffing Anesthesiologist: Ronelle NighEWELL, Camauri Craton Performed by: anesthesiologist  Preanesthetic Checklist Completed: patient identified, surgical consent, pre-op evaluation, timeout performed, IV checked, risks and benefits discussed and monitors and equipment checked Spinal Block Patient position: sitting Prep: site prepped and draped and DuraPrep Patient monitoring: heart rate, cardiac monitor, continuous pulse ox and blood pressure Approach: midline Location: L3-4 Injection technique: single-shot Needle Needle type: Pencan  Needle gauge: 24 G Needle length: 10 cm Assessment Sensory level: T4

## 2015-11-01 NOTE — Anesthesia Postprocedure Evaluation (Signed)
Anesthesia Post Note  Patient: Jane Ayers  Procedure(s) Performed: Procedure(s) (LRB): CESAREAN SECTION (N/A)  Patient location during evaluation: PACU Anesthesia Type: Spinal Level of consciousness: awake and alert Pain management: pain level controlled Vital Signs Assessment: post-procedure vital signs reviewed and stable Respiratory status: spontaneous breathing, nonlabored ventilation, respiratory function stable and patient connected to nasal cannula oxygen Cardiovascular status: blood pressure returned to baseline and stable Postop Assessment: no signs of nausea or vomiting Anesthetic complications: no     Last Vitals:  Filed Vitals:   11/01/15 1215 11/01/15 1230  BP: 135/75 123/71  Pulse: 88 80  Temp: 36.9 C   Resp: 19 18    Last Pain:  Filed Vitals:   11/01/15 1243  PainSc: 0-No pain   Pain Goal: Patients Stated Pain Goal: 0 (11/01/15 0549)               [name]

## 2015-11-01 NOTE — Progress Notes (Signed)
Dr Juliene PinaMody notified of pt's admission and status. Aware of ctx pattern, sve, pt's elevated pulse, current tx for sinus infection. Orders received

## 2015-11-01 NOTE — Lactation Note (Signed)
This note was copied from a baby's chart. Lactation Consultation Note  Patient Name: Jane Ayers ZOXWR'UToday's Date: 11/01/2015 Reason for consult: Initial assessment Baby at 5 hr of life. Experienced bf mom worried about latch. Mom states baby is not opening her mouth wide. Baby was in football position upon entry. Showed mom how to take baby off the breast. Her nipple was compressed. Had mom support the breast and "tea cup" the nipple. Baby was able to get a deep latch that mom reports was more comfortable. She bf her older child for 1327m because of low supply. She thinks the low supply was from the stress of frequent moving and lack of support. Discussed baby behavior, feeding frequency, baby belly size, voids, wt loss, breast changes, and nipple care. She stated she can manually express, has seen colostrum bilaterally, and has a spoon in the room. Given lactation handouts. Aware of OP services and support group.     Maternal Data    Feeding Feeding Type: Breast Fed Length of feed: 20 min  LATCH Score/Interventions Latch: Repeated attempts needed to sustain latch, nipple held in mouth throughout feeding, stimulation needed to elicit sucking reflex. Intervention(s): Adjust position;Breast compression;Assist with latch  Audible Swallowing: A few with stimulation Intervention(s): Alternate breast massage;Skin to skin;Hand expression  Type of Nipple: Everted at rest and after stimulation  Comfort (Breast/Nipple): Soft / non-tender     Hold (Positioning): Assistance needed to correctly position infant at breast and maintain latch. Intervention(s): Position options;Support Pillows  LATCH Score: 7  Lactation Tools Discussed/Used WIC Program: Yes   Consult Status Consult Status: Follow-up Date: 11/02/15 Follow-up type: In-patient    Rulon Eisenmengerlizabeth E Sarissa Dern 11/01/2015, 4:08 PM

## 2015-11-01 NOTE — Transfer of Care (Signed)
Immediate Anesthesia Transfer of Care Note  Patient: Jane Ayers  Procedure(s) Performed: Procedure(s): CESAREAN SECTION (N/A)  Patient Location: PACU  Anesthesia Type:Spinal  Level of Consciousness: awake, alert  and oriented  Airway & Oxygen Therapy: Patient Spontanous Breathing  Post-op Assessment: Report given to RN and Post -op Vital signs reviewed and stable  Post vital signs: Reviewed and stable  Last Vitals:  Filed Vitals:   11/01/15 0735 11/01/15 0739  BP:    Pulse: 108 107  Temp:    Resp:      Last Pain:  Filed Vitals:   11/01/15 0739  PainSc: 3       Patients Stated Pain Goal: 0 (11/01/15 0549)  Complications: No apparent anesthesia complications

## 2015-11-01 NOTE — Progress Notes (Signed)
Dr. Juliene PinaMody notified of pt in MAU.  Notified that pt had received her fluid bolus.  Notified of urinalysis results.  Updated on pt pulse.  Provider states she will come see pt.

## 2015-11-01 NOTE — Op Note (Signed)
Cesarean Section Procedure Note   Jane Ayers  11/01/2015  Indications: 38.1 weeks, labor, prior C/section, repeat desired   Pre-operative Diagnosis: repeat cesarean, active labor .   Post-operative Diagnosis: Same   Surgeon: Surgeon(s) and Role:    * Shea EvansVaishali Imanni Burdine, MD - Primary   Assistants: Marlinda Mikeanya Bailey, CNM   Anesthesia: spinal   Procedure Details:  The patient was seen in the Holding Room. The risks, benefits, complications, treatment options, and expected outcomes were discussed with the patient. The patient concurred with the proposed plan, giving informed consent. identified as Jane Ayers and the procedure verified as C-Section Delivery. She was brought to the Operating Room. A Time Out was held and the above information confirmed. Gentamicin/ Clindamycin given for prophylaxis.  After induction of Spinal anesthesia, the patient was draped and prepped in the usual sterile manner and foley was noted to be draining well.  A Pfannenstiel Incision was made and carried down through the subcutaneous tissue to the fascia. Fascial incision was made and extended transversely. The fascia was separated from the underlying rectus tissue superiorly and inferiorly. The peritoneum was identified and entered. Peritoneal incision was extended longitudinally. The utero-vesical peritoneal reflection was incised transversely and the bladder flap was bluntly freed from the lower uterine segment. Alexis retractor was placed carefully. Lower segment was intact. No adhesions noted.  A low transverse uterine incision was made. Amniotomy done, clear fluid.  Delivered from cephalic presentation was a vigorous female infant at 10.17 am on 11/01/15  with Apgar scores of 8 at one minute and 9 at five minutes. Cord clamped and cut at 1 minute and baby handed to NICU team. Cord ph was not sent, umbilical cord blood was obtained for evaluation. The placenta was removed Intact and appeared normal. The uterine outline,  tubes and ovaries appeared normal. The uterine incision was closed with running locked sutures of 0Monocryl followed by a second imbricating layer.  Hemostasis was observed. Peritoneal gutters cleared. Alexis retractor removed. Peritoneal closure done with 2-0 Vicryl running. Muscles approximated in lower third area. The fascia was then reapproximated with running sutures of 0Vicryl. The subcuticular closure was performed using 2-0plain gut. The skin was closed with 4-0Vicryl. Honeycomb dressing placed. Steristrips deferred due to possible allergic reaction after last surgery.  Instrument, sponge, and needle counts were correct prior the abdominal closure and were correct at the conclusion of the case.   Findings: Female infant delivered cephalic at 10.17 am on 11/01/2015. Apgars 8 and 9. Normal tubes, ovaries, placenta, cord.    Estimated Blood Loss: 1000 cc  Total IV Fluids: 3500 ml LR   Urine Output: 300CC OF clear urine  Specimens: cord blood  Complications: no complications  Disposition: PACU - hemodynamically stable.   Maternal Condition: stable   Baby condition / location:  Couplet care / Skin to Skin  Attending Attestation: I performed the procedure.   Signed: Surgeon(s): Shea EvansVaishali Devell Parkerson, MD

## 2015-11-01 NOTE — MAU Note (Addendum)
Urine collected and sent to the lab

## 2015-11-02 ENCOUNTER — Encounter (HOSPITAL_COMMUNITY): Payer: Self-pay | Admitting: Obstetrics and Gynecology

## 2015-11-02 DIAGNOSIS — D62 Acute posthemorrhagic anemia: Secondary | ICD-10-CM | POA: Diagnosis not present

## 2015-11-02 DIAGNOSIS — IMO0001 Reserved for inherently not codable concepts without codable children: Secondary | ICD-10-CM

## 2015-11-02 HISTORY — DX: Acute posthemorrhagic anemia: D62

## 2015-11-02 HISTORY — DX: Reserved for inherently not codable concepts without codable children: IMO0001

## 2015-11-02 LAB — CBC
HCT: 24.3 % — ABNORMAL LOW (ref 36.0–46.0)
Hemoglobin: 7.7 g/dL — ABNORMAL LOW (ref 12.0–15.0)
MCH: 25.9 pg — AB (ref 26.0–34.0)
MCHC: 31.7 g/dL (ref 30.0–36.0)
MCV: 81.8 fL (ref 78.0–100.0)
PLATELETS: 234 10*3/uL (ref 150–400)
RBC: 2.97 MIL/uL — AB (ref 3.87–5.11)
RDW: 15.8 % — AB (ref 11.5–15.5)
WBC: 14.4 10*3/uL — ABNORMAL HIGH (ref 4.0–10.5)

## 2015-11-02 LAB — RPR: RPR Ser Ql: NONREACTIVE

## 2015-11-02 MED ORDER — POLYSACCHARIDE IRON COMPLEX 150 MG PO CAPS
150.0000 mg | ORAL_CAPSULE | Freq: Two times a day (BID) | ORAL | Status: DC
  Administered 2015-11-02 – 2015-11-03 (×3): 150 mg via ORAL
  Filled 2015-11-02 (×3): qty 1

## 2015-11-02 MED ORDER — MAGNESIUM OXIDE 400 (241.3 MG) MG PO TABS
400.0000 mg | ORAL_TABLET | Freq: Every day | ORAL | Status: DC
  Administered 2015-11-02: 400 mg via ORAL
  Filled 2015-11-02 (×3): qty 1

## 2015-11-02 NOTE — Anesthesia Postprocedure Evaluation (Signed)
Anesthesia Post Note  Patient: Jane Ayers  Procedure(s) Performed: Procedure(s) (LRB): CESAREAN SECTION (N/A)  Patient location during evaluation: Mother Baby Anesthesia Type: Spinal Level of consciousness: awake Pain management: satisfactory to patient Vital Signs Assessment: post-procedure vital signs reviewed and stable Respiratory status: spontaneous breathing Cardiovascular status: stable Anesthetic complications: no     Last Vitals:  Filed Vitals:   11/02/15 0400 11/02/15 0920  BP: 108/69 115/63  Pulse: 57 63  Temp: 36.6 C 36.6 C  Resp: 16 18    Last Pain:  Filed Vitals:   11/02/15 1334  PainSc: 7    Pain Goal: Patients Stated Pain Goal: 0 (11/01/15 0549)               Cephus ShellingBURGER,[name]

## 2015-11-02 NOTE — Progress Notes (Signed)
Patient ID: Bebe Literaylor R Tomlinson, female   DOB: 07/15/1990, 25 y.o.   MRN: [id number] Subjective: S/P Repeat Cesarean Delivery POD# 2 Information for the patient's newborn:  Tammi SouWarren, Girl Minerva [[id number]][[id number]]  female   Reports feeling more sore today. Feeding: breast Patient reports tolerating PO.  Breast symptoms: none Pain controlled with ibuprofen (OTC) and narcotic analgesics including Percocet Denies HA/SOB/C/P/N/V/dizziness. Flatus pesent. No BM. She reports vaginal bleeding as normal, without clots.  She is ambulating, urinating without difficult.     Objective:   VS:  Filed Vitals:   11/01/15 1606 11/01/15 2000 11/02/15 0035 11/02/15 0400  BP: 107/71 114/54 110/64 108/69  Pulse: 90 64 63 57  Temp:  98.1 F (36.7 C) 98.1 F (36.7 C) 97.8 F (36.6 C)  TempSrc:  Oral Oral Oral  Resp: 19 16 16 16   Height:      Weight:      SpO2: 99% 100% 98%      Intake/Output Summary (Last 24 hours) at 11/02/15 0917 Last data filed at 11/02/15 0531  Gross per 24 hour  Intake 5012.5 ml  Output   4585 ml  Net  427.5 ml        Recent Labs  11/01/15 1747 11/02/15 0512  WBC 17.2* 14.4*  HGB 9.0* 7.7*  HCT 28.0* 24.3*  PLT 243 234     Blood type: O POS (06/04 0655)  Rubella: Immune     Physical Exam:   General: alert, cooperative, fatigued and no distress  CV: Regular rate and rhythm, S1S2 present or without murmur or extra heart sounds  Resp: clear  Abdomen: soft, nontender, normal bowel sounds  Incision: dried serous drainage present with borders marked and skin well-approximated with sutures  Uterine Fundus: firm, 2 FB below umbilicus, nontender  Lochia: minimal  Ext: edema trace and Homans sign is negative, no sign of DVT   Assessment/Plan: 25 y.o.   POD# 2.  S/P Cesarean Delivery.  Indications: repeat                Principal Problem:   Postpartum care following cesarean delivery (6/3) Active Problems:   Hypothyroidism in pregnancy   Active labor at term   Maternal  iron deficiency anemia   Acute blood loss anemia  Doing well, stable.               Regular diet as tolerated Ambulate Routine post-op care Declined early D/C home today  Raelyn MoraAWSON, Nikeisha Klutz, M, MSN, CNM 11/02/2015, 9:17 AM

## 2015-11-02 NOTE — Lactation Note (Signed)
This note was copied from a baby's chart. Lactation Consultation Note  Patient Name: Jane Ayers ZOXWR'UToday's Date: 11/02/2015 Reason for consult: Follow-up assessment  Baby 28 hours old. Mom reports that baby just nursed within the last 30 minutes and latched deeply, nursing well for 20 minutes with swallows noted. Mom states that her nipple was not pinched when the baby came off the breast. Mom states that it took 20 minutes to get the baby to latch deeply, but once she latched well, she was able to maintain the latch and mom had no discomfort. Mom reports that the baby was initially pushing her nipple out by thrusting her tongue for those 20 minutes prior to the deep latch. Enc mom to provide suck training between nursing times, and just before latching the baby. Enc mom to call for assistance as needed from her nurse with latching. Mom aware of OP/BFSG and LC phone line assistance after D/C.   Mom has hypothyroidism and states that her thyroid levels are erratic. Enc mom to have follow-up with her HCP regarding her thyroid levels and discussed how this can impact her breast milk levels.  Maternal Data    Feeding Feeding Type: Breast Fed Length of feed: 15 min  LATCH Score/Interventions                      Lactation Tools Discussed/Used     Consult Status Consult Status: Follow-up Date: 11/03/15 Follow-up type: In-patient    Geralynn OchsWILLIARD, Jane Ayers 11/02/2015, 3:17 PM

## 2015-11-02 NOTE — Addendum Note (Signed)
Addendum  created 11/02/15 1351 by Algis GreenhouseLinda A Riaz Onorato, CRNA   Modules edited: Clinical Notes   Clinical Notes:  File: [id number]

## 2015-11-03 MED ORDER — MAGNESIUM OXIDE 400 (241.3 MG) MG PO TABS: 400.0000 mg | ORAL_TABLET | Freq: Every day | ORAL | Status: DC

## 2015-11-03 MED ORDER — POLYSACCHARIDE IRON COMPLEX 150 MG PO CAPS: 150.0000 mg | ORAL_CAPSULE | Freq: Two times a day (BID) | ORAL | Status: DC

## 2015-11-03 MED ORDER — OXYCODONE-ACETAMINOPHEN 5-325 MG PO TABS: 1.0000 | ORAL_TABLET | ORAL | Status: DC | PRN

## 2015-11-03 MED ORDER — IBUPROFEN 600 MG PO TABS: 600.0000 mg | ORAL_TABLET | Freq: Four times a day (QID) | ORAL | Status: DC

## 2015-11-03 NOTE — Lactation Note (Signed)
This note was copied from a baby's chart. Lactation Consultation Note  Baby 48 hrs old with 10% weight loss. Feeding assessment: Mother's milk is transitioning and stools are yellow. Mother can easily hand express.  Had mother prepump w/ manual pump to soften nipple for baby's small mouth. Suggest she also prepump off some foremilk to get to hindmilk on 1st breast.  Breastfeed on both breasts per feeding. Baby latched on L side.  Mother has compression ridge.  Assisted w/ chin tug  and breast compression to achieve a deeper latch. Blister on R side.  Gave mother coconut oil and shells to wear for after feeding.   Sucks and swallows heard.  Encouraged mother to compress/massage breast during feeding to keep baby active. Mother states baby usually falls asleep after 10 min.  Observed feeding on L side for 20 min and taught mother how to keep baby awake. Baby was burped and latched on R side.  Feeding lasted approx 28 min. Taught mother how to cup feed and suggest she give pumped breastmilk to baby at next feeding.  Discussed milk room storage. Baby can protrude tongue out of mouth. Reviewed engorgement care and monitoring voids/stools. Mom encouraged to feed baby 8-12 times/24 hours and with feeding cues. Wake baby for feedings if needed. [facility].   Patient Name: Jane Ayers LKGMW'NToday's Date: 11/03/2015 Reason for consult: Follow-up assessment   Maternal Data    Feeding Feeding Type: Breast Fed Length of feed: 28 min  LATCH Score/Interventions Latch: Grasps breast easily, tongue down, lips flanged, rhythmical sucking. Intervention(s): Breast compression;Breast massage;Adjust position  Audible Swallowing: Spontaneous and intermittent Intervention(s): Alternate breast massage  Type of Nipple: Everted at rest and after stimulation  Comfort (Breast/Nipple): Filling, red/small blisters or bruises, mild/mod discomfort  Problem noted: Mild/Moderate  discomfort Interventions (Mild/moderate discomfort): Hand expression (shells and coconut oil)  Hold (Positioning): Assistance needed to correctly position infant at breast and maintain latch.  LATCH Score: 8  Lactation Tools Discussed/Used     Consult Status Consult Status: Complete Date: 11/03/15 Follow-up type: In-patient    Jane Ayers, Jane Ayers 11/03/2015, 11:13 AM

## 2015-11-03 NOTE — Discharge Instructions (Signed)
Daily prenatal vitamin Daily Polysacride iron with magnesium  for anemia   Take over-the-counter vitamin D supplement 2000 iu daily

## 2015-11-03 NOTE — Lactation Note (Signed)
This note was copied from a baby's chart. Lactation Consultation Note  P2, 10% weight loss. Left LC phone number to call to check latch w/ next feeding.  Patient Name: Jane Ayers Today's Date: 11/03/2015     Maternal Data    Feeding Feeding Type: Breast Fed Length of feed: 10 min  LATCH Score/Interventions Latch: Repeated attempts needed to sustain latch, nipple held in mouth throughout feeding, stimulation needed to elicit sucking reflex. Intervention(s): Adjust position;Assist with latch;Breast compression;Breast massage  Audible Swallowing: Spontaneous and intermittent Intervention(s): Skin to skin;Hand expression  Type of Nipple: Everted at rest and after stimulation  Comfort (Breast/Nipple): Soft / non-tender     Hold (Positioning): Assistance needed to correctly position infant at breast and maintain latch.  LATCH Score: 8  Lactation Tools Discussed/Used     Consult Status      Dahlia ByesBerkelhammer, Ruth [facility] Center[facility] 11/03/2015, 10:06 AM

## 2015-11-03 NOTE — Progress Notes (Signed)
POSTOPERATIVE DAY # 2 S/P CS   S:         Reports feeling well - working on BF & would like DC today if possible             Tolerating po intake / no nausea / no vomiting / + flatus / + BM             Bleeding is light             Pain controlled with motrin and percocet             Up ad lib / ambulatory/ voiding QS  Newborn breast feeding    O:  VS: BP 120/64 mmHg  Pulse 74  Temp(Src) 98 F (36.7 C) (Oral)  Resp 19  Ht 5\' 4"  (1.626 m)  Wt 96.707 kg (213 lb 3.2 oz)  BMI 36.58 kg/m2  SpO2 97%  Breastfeeding? Unknown   LABS:               Recent Labs  11/01/15 1747 11/02/15 0512  WBC 17.2* 14.4*  HGB 9.0* 7.7*  PLT 243 234               Bloodtype: --/--/O POS, O POS (06/04 [id number])             Physical Exam:             Alert and Oriented X3  Lungs: Clear and unlabored  Heart: regular rate and rhythm / no mumurs  Abdomen: soft, non-tender, non-distended, active bs             Fundus: firm, non-tender, U-1             Dressing intact honeycomb              Incision:  approximated with suture / no erythema / no ecchymosis / no drainage  Perineum: intact  Lochia: light  Extremities: no edema, no calf pain or tenderness, negative Homans  A:        POD # 2 S/P CS            IDA with ABL anemia  P:        Routine postoperative care              DC home             Iron and magnesium x 6 weeks with PNV             WOB booklet - instructions to call    Marlinda MikeBAILEY, Alonah Lineback CNM, MSN, FACNM 11/03/2015, 10:00 AM

## 2015-11-03 NOTE — Discharge Summary (Signed)
POSTOPERATIVE DISCHARGE SUMMARY:  Patient ID: Jane Ayers MRN: [id number] DOB/AGE: 26/01/1991 26 y.o.  Admit date: 11/01/2015 Admission Diagnoses: 38 weeks / labor  Previous cesarean section - desires repeat  Discharge date:  11/03/2015 Discharge Diagnoses: POD 2 s/p repeat cesarean section  Prenatal history: G2P2002   EDC : 11/14/2015, Alternate EDD Entry  [facility]  Primary provider : Ernestina PennaFogleman Prenatal course complicated by previous CS / obesity / depression hx / hypothyroidism / IDA of pregnancy  Prenatal Labs: ABO, Rh: --/--/O POS, O POS (06/04 [id number])  Antibody: NEG (06/04 0655) Rubella:    RPR: Non Reactive (06/04 1747)  HBsAg:   negative HIV:   NR  Medical / Surgical History :  Past medical history:  Past Medical History  Diagnosis Date  . Thyroid disorder   . Ovarian cyst   . Abnormal Pap smear   . GERD (gastroesophageal reflux disease)   . Hypothyroidism   . Vaginal Pap smear, abnormal   . Complication of anesthesia     aggressive after anesthesia, severe vomitting after CS  . Active labor at term 11/01/2015  . Postpartum care following cesarean delivery (6/3) 11/02/2015  . Maternal iron deficiency anemia 11/02/2015  . Acute blood loss anemia 11/02/2015    Past surgical history:  Past Surgical History  Procedure Laterality Date  . Appendectomy    . Cesarean section    . Cyst removal from left ovary    . Mouth surgery    . Cesarean section N/A 11/01/2015    Procedure: CESAREAN SECTION;  Surgeon: Shea EvansVaishali Mody, MD;  [facility];  Service: Obstetrics;  Laterality: N/A;    Family History:  Family History  Problem Relation Age of Onset  . Other Neg Hx   . Asthma Mother   . Hypertension Mother   . Hyperlipidemia Mother   . Arthritis Father   . Diabetes Father   . Alcohol abuse Father   . Asthma Sister   . Hypertension Maternal Aunt   . Hyperlipidemia Maternal Aunt   . Hypertension Maternal Uncle   .  Hyperlipidemia Maternal Uncle   . Diabetes Maternal Grandmother   . Hypertension Maternal Grandfather   . Hyperlipidemia Maternal Grandfather     Social History:  reports that she has never smoked. She has never used smokeless tobacco. She reports that she does not drink alcohol or use illicit drugs.  Allergies: Penicillins; Amoxicillin; Iodine; Latex; and Shellfish allergy   Current Medications at time of admission:  Prior to Admission medications   Medication Sig Start Date End Date Taking? Authorizing Provider  acetaminophen (TYLENOL) 500 MG tablet Take 500 mg by mouth every 6 (six) hours as needed for moderate pain or headache.   Yes Historical Provider, MD  azithromycin (ZITHROMAX) 250 MG tablet Take 250 mg by mouth daily.   Yes Historical Provider, MD  calcium carbonate (TUMS - DOSED IN MG ELEMENTAL CALCIUM) 500 MG chewable tablet Chew 2 tablets by mouth daily.   Yes Historical Provider, MD  levothyroxine (SYNTHROID, LEVOTHROID) 200 MCG tablet Take 200 mcg by mouth daily before breakfast.   Yes Historical Provider, MD  Prenatal Vit-Fe Fumarate-FA (PRENATAL MULTIVITAMIN) TABS tablet Take 1 tablet by mouth daily at 12 noon.    Yes Historical Provider, MD  terconazole (TERAZOL 3) 0.8 % vaginal cream Place 1 applicator vaginally at bedtime.   Yes Historical Provider, MD  albuterol (PROVENTIL HFA;VENTOLIN HFA) 108 (90 BASE) MCG/ACT inhaler Inhale 2 puffs into the  lungs every 6 (six) hours as needed for wheezing or shortness of breath. 05/12/15   Raelyn Mora, CNM    Intrapartum Course:  Admit for onset of labor  Interventions required: repeat cesarean section  Procedures: Cesarean section delivery on 11/01/2015 with delivery of viable female newborn by Dr Juliene Pina   See operative report for further details APGAR (1 MIN): 8   APGAR (5 MINS): 9    Postoperative / postpartum course:  Uncomplicated with discharge on POD 2 Complicated by: ABL anemia compounding IDA of  pregnancy  Discharge Instructions:  Discharged Condition: stable  Activity: pelvic rest and postoperative restrictions x 2   Diet: routine  Medications:    Medication List    STOP taking these medications        calcium carbonate 500 MG chewable tablet  Commonly known as:  TUMS - dosed in mg elemental calcium      TAKE these medications        acetaminophen 500 MG tablet  Commonly known as:  TYLENOL  Take 500 mg by mouth every 6 (six) hours as needed for moderate pain or headache.     albuterol 108 (90 Base) MCG/ACT inhaler  Commonly known as:  PROVENTIL HFA;VENTOLIN HFA  Inhale 2 puffs into the lungs every 6 (six) hours as needed for wheezing or shortness of breath.     azithromycin 250 MG tablet  Commonly known as:  ZITHROMAX  Take 250 mg by mouth daily.     ibuprofen 600 MG tablet  Commonly known as:  ADVIL,MOTRIN  Take 1 tablet (600 mg total) by mouth every 6 (six) hours.     iron polysaccharides 150 MG capsule  Commonly known as:  NIFEREX  Take 1 capsule (150 mg total) by mouth 2 (two) times daily.     levothyroxine 200 MCG tablet  Commonly known as:  SYNTHROID, LEVOTHROID  Take 200 mcg by mouth daily before breakfast.     magnesium oxide 400 (241.3 Mg) MG tablet  Commonly known as:  MAG-OX  Take 1 tablet (400 mg total) by mouth daily.     oxyCODONE-acetaminophen 5-325 MG tablet  Commonly known as:  PERCOCET/ROXICET  Take 1 tablet by mouth every 4 (four) hours as needed (pain scale 4-7).     prenatal multivitamin Tabs tablet  Take 1 tablet by mouth daily at 12 noon.     terconazole 0.8 % vaginal cream  Commonly known as:  TERAZOL 3  Place 1 applicator vaginally at bedtime.        Wound Care: keep clean and dry / remove honeycomb POD 5 Postpartum Instructions: [facility] discharge booklet - instructions reviewed  Discharge to: Home  Follow up :  Check thyroid and CBC and vitamin D at [facility] visit [facility] in 6 weeks for routine postpartum visit  with Dr Ernestina Penna                Signed: Marlinda Mike CNM, MSN, [facility] 11/03/2015, 10:07 AM

## 2015-11-06 ENCOUNTER — Encounter (HOSPITAL_COMMUNITY)
Admission: RE | Admit: 2015-11-06 | Discharge: 2015-11-06 | Disposition: A | Discharge status: Home / Self Care | Payer: 59 | Source: Ambulatory Visit

## 2015-11-06 HISTORY — DX: Adverse effect of unspecified anesthetic, initial encounter: T41.45XA

## 2015-11-06 HISTORY — DX: Unspecified abnormal cytological findings in specimens from vagina: R87.629

## 2015-11-06 HISTORY — DX: Other complications of anesthesia, initial encounter: T88.59XA

## 2015-11-06 HISTORY — DX: Hypothyroidism, unspecified: E03.9

## 2015-11-06 HISTORY — DX: Gastro-esophageal reflux disease without esophagitis: K21.9

## 2015-11-07 ENCOUNTER — Inpatient Hospital Stay (HOSPITAL_COMMUNITY): Admission: RE | Admit: 2015-11-07 | Source: Ambulatory Visit | Admitting: Obstetrics and Gynecology

## 2015-11-07 SURGERY — Surgical Case
Anesthesia: Regional

## 2016-09-15 ENCOUNTER — Encounter (HOSPITAL_COMMUNITY): Payer: Self-pay | Admitting: Emergency Medicine

## 2016-09-15 ENCOUNTER — Emergency Department (HOSPITAL_COMMUNITY)
Admission: EM | Admit: 2016-09-15 | Discharge: 2016-09-15 | Disposition: A | Discharge status: Home / Self Care | Attending: Emergency Medicine | Admitting: Emergency Medicine

## 2016-09-15 DIAGNOSIS — H578 Other specified disorders of eye and adnexa: Secondary | ICD-10-CM | POA: Diagnosis present

## 2016-09-15 DIAGNOSIS — Z91040 Latex allergy status: Secondary | ICD-10-CM | POA: Diagnosis not present

## 2016-09-15 DIAGNOSIS — H1089 Other conjunctivitis: Secondary | ICD-10-CM | POA: Insufficient documentation

## 2016-09-15 DIAGNOSIS — E039 Hypothyroidism, unspecified: Secondary | ICD-10-CM | POA: Insufficient documentation

## 2016-09-15 DIAGNOSIS — H109 Unspecified conjunctivitis: Secondary | ICD-10-CM

## 2016-09-15 MED ORDER — TETRACAINE HCL 0.5 % OP SOLN
2.0000 [drp] | Freq: Once | OPHTHALMIC | Status: AC
  Administered 2016-09-15: 2 [drp] via OPHTHALMIC
  Filled 2016-09-15: qty 2

## 2016-09-15 MED ORDER — POLYMYXIN B-TRIMETHOPRIM 10000-0.1 UNIT/ML-% OP SOLN
1.0000 [drp] | OPHTHALMIC | Status: DC
  Administered 2016-09-15: 1 [drp] via OPHTHALMIC
  Filled 2016-09-15: qty 10

## 2016-09-15 MED ORDER — FLUORESCEIN SODIUM 0.6 MG OP STRP
1.0000 | ORAL_STRIP | Freq: Once | OPHTHALMIC | Status: AC
  Administered 2016-09-15: 1 via OPHTHALMIC
  Filled 2016-09-15: qty 1

## 2016-09-15 MED ORDER — ERYTHROMYCIN 5 MG/GM OP OINT
1.0000 "application " | TOPICAL_OINTMENT | Freq: Four times a day (QID) | OPHTHALMIC | Status: DC
  Filled 2016-09-15: qty 3.5

## 2016-09-15 MED ORDER — POLYMYXIN B-TRIMETHOPRIM 10000-0.1 UNIT/ML-% OP SOLN: 1.0000 [drp] | OPHTHALMIC | 0 refills | Status: AC

## 2016-09-15 NOTE — ED Triage Notes (Signed)
Pt sts right eye pain and sent here by [facility] for further eval

## 2016-09-15 NOTE — ED Notes (Signed)
Slit Lamp at bedside

## 2016-09-15 NOTE — Discharge Instructions (Signed)
Please read instructions below. Apply 1 drop to your right eye every 4 hours for 7 days. You can apply warm compresses to your eye as well. Wash your hands frequently. Return to the ER for vision loss, increased pain, new or worsening symptoms.
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
## 2018-01-18 ENCOUNTER — Emergency Department (HOSPITAL_COMMUNITY)

## 2018-01-18 ENCOUNTER — Emergency Department (HOSPITAL_COMMUNITY)
Admission: EM | Admit: 2018-01-18 | Discharge: 2018-01-19 | Disposition: A | Discharge status: Home / Self Care | Attending: Emergency Medicine | Admitting: Emergency Medicine

## 2018-01-18 ENCOUNTER — Encounter (HOSPITAL_COMMUNITY): Payer: Self-pay | Admitting: Student

## 2018-01-18 ENCOUNTER — Other Ambulatory Visit: Payer: Self-pay

## 2018-01-18 DIAGNOSIS — R1011 Right upper quadrant pain: Secondary | ICD-10-CM | POA: Insufficient documentation

## 2018-01-18 DIAGNOSIS — R109 Unspecified abdominal pain: Secondary | ICD-10-CM

## 2018-01-18 DIAGNOSIS — Z79899 Other long term (current) drug therapy: Secondary | ICD-10-CM | POA: Insufficient documentation

## 2018-01-18 DIAGNOSIS — E039 Hypothyroidism, unspecified: Secondary | ICD-10-CM | POA: Diagnosis not present

## 2018-01-18 LAB — URINALYSIS, ROUTINE W REFLEX MICROSCOPIC
BILIRUBIN URINE: NEGATIVE
GLUCOSE, UA: NEGATIVE mg/dL
HGB URINE DIPSTICK: NEGATIVE
Ketones, ur: NEGATIVE mg/dL
Leukocytes, UA: NEGATIVE
Nitrite: NEGATIVE
PH: 7 (ref 5.0–8.0)
Protein, ur: NEGATIVE mg/dL
SPECIFIC GRAVITY, URINE: 1.018 (ref 1.005–1.030)

## 2018-01-18 LAB — BASIC METABOLIC PANEL
Anion gap: 9 (ref 5–15)
BUN: 12 mg/dL (ref 6–20)
CHLORIDE: 103 mmol/L (ref 98–111)
CO2: 27 mmol/L (ref 22–32)
Calcium: 9.4 mg/dL (ref 8.9–10.3)
Creatinine, Ser: 1 mg/dL (ref 0.44–1.00)
GFR calc Af Amer: >60 mL/min (ref >60)
GFR calc non Af Amer: >60 mL/min (ref >60)
GLUCOSE: 104 mg/dL — AB (ref 70–99)
POTASSIUM: 3.9 mmol/L (ref 3.5–5.1)
Sodium: 139 mmol/L (ref 135–145)

## 2018-01-18 LAB — CBC
HEMATOCRIT: 40 % (ref 36.0–46.0)
Hemoglobin: 13 g/dL (ref 12.0–15.0)
MCH: 30 pg (ref 26.0–34.0)
MCHC: 32.5 g/dL (ref 30.0–36.0)
MCV: 92.4 fL (ref 78.0–100.0)
Platelets: 312 10*3/uL (ref 150–400)
RBC: 4.33 MIL/uL (ref 3.87–5.11)
RDW: 12.6 % (ref 11.5–15.5)
WBC: 10.9 10*3/uL — ABNORMAL HIGH (ref 4.0–10.5)

## 2018-01-18 LAB — I-STAT BETA HCG BLOOD, ED (MC, WL, AP ONLY)

## 2018-01-18 MED ORDER — ONDANSETRON 4 MG PO TBDP
4.0000 mg | ORAL_TABLET | Freq: Once | ORAL | Status: AC
  Administered 2018-01-19: 4 mg via ORAL
  Filled 2018-01-18: qty 1

## 2018-01-18 MED ORDER — GI COCKTAIL ~~LOC~~
30.0000 mL | Freq: Once | ORAL | Status: AC
  Administered 2018-01-19: 30 mL via ORAL
  Filled 2018-01-18: qty 30
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
## 2018-01-18 NOTE — ED Triage Notes (Signed)
Pt states she has had recurring intermittent right side pain radiating to her back for about 2 weeks. Approximately 3 days ago began having this pain with accompanied with nausea and a couple instances of vomiting.

## 2018-01-19 ENCOUNTER — Emergency Department (HOSPITAL_COMMUNITY)

## 2018-01-19 MED ORDER — RANITIDINE HCL 150 MG PO CAPS: 150.0000 mg | ORAL_CAPSULE | Freq: Every day | ORAL | 0 refills | Status: AC

## 2018-01-19 NOTE — Discharge Instructions (Signed)
You were seen in the emergency department this evening for abdominal pain.  Your labs were all fairly normal.  Ultrasound of your gallbladder did show that it is contracted, this can be because you ate recently or because of chronic gallbladder problems.  With this being said we are going to start you on Zantac, and antiacid medication, please take this daily.  We have prescribed you new medication(s) today. Discuss the medications prescribed today with your pharmacist as they can have adverse effects and interactions with your other medicines including over the counter and prescribed medications. Seek medical evaluation if you start to experience new or abnormal symptoms after taking one of these medicines, seek care immediately if you start to experience difficulty breathing, feeling of your throat closing, facial swelling, or rash as these could be indications of a more serious allergic reaction  We additionally would like you to follow-up with your primary care provider as well as a general surgeon for reassessment and possible further intervention.  We have given you information for Dr. Lindie SpruceWyatt, a surgeon in the area.  Please return to the ER for new or worsening symptoms including but not limited to worsening pain, fevers, inability to keep food/fluids down, blood in your stool, chest pain, trouble breathing, or any other concerns that you may have.

## 2018-01-19 NOTE — ED Notes (Signed)
Pt. To U/S via stretcher.

## 2018-10-09 IMAGING — US US ABDOMEN LIMITED
1 series · 14 of 25 positions shown · non-contrast
Comparison: None.

CLINICAL DATA: Right upper quadrant pain for 2 weeks.

EXAM:
ULTRASOUND ABDOMEN LIMITED RIGHT UPPER QUADRANT

[Series 1: us abdomen limited · 0.25mm/px · 14 of 48 slices shown]
[im 1/48]
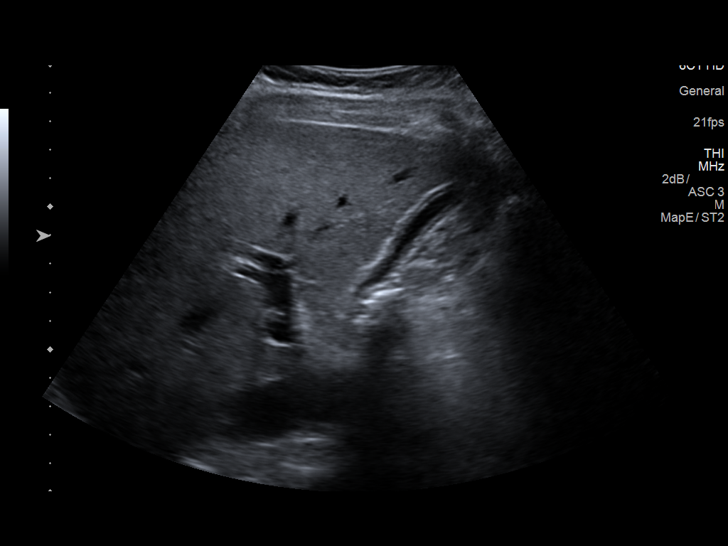
[im 4/48]
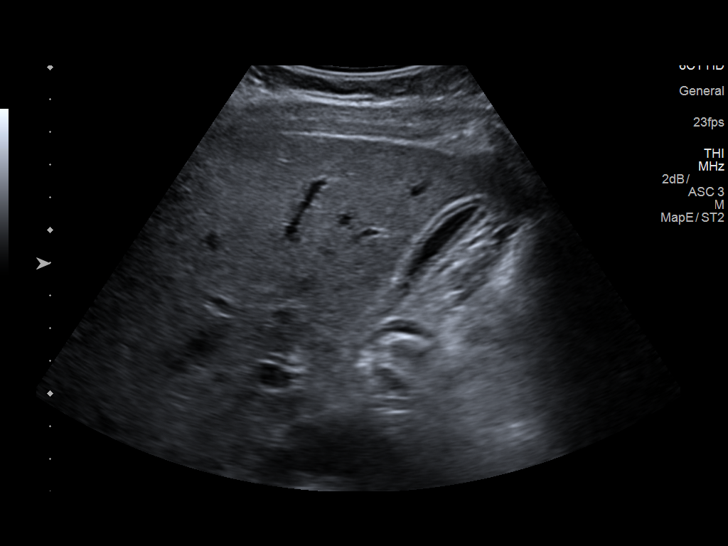
[im 8/48]
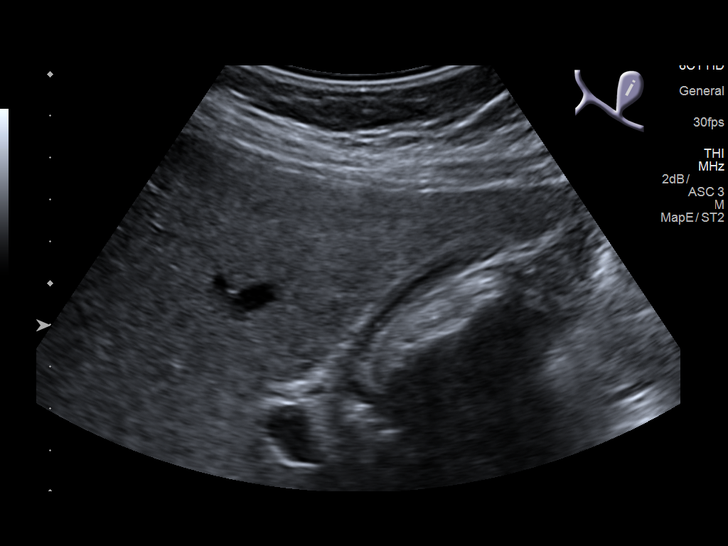
[im 12/48]
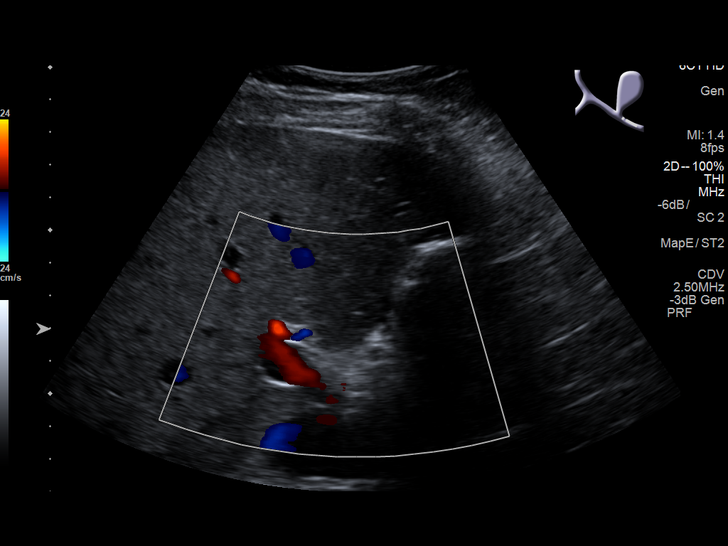
[im 16/48]
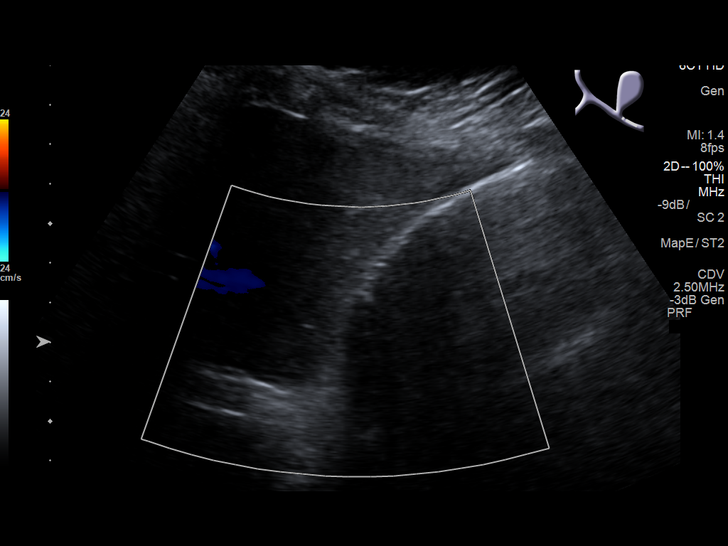
[im 18/48]
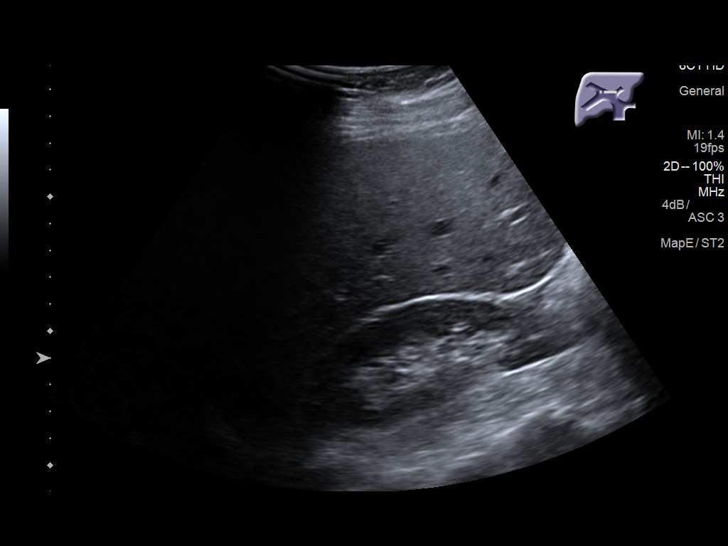
[im 22/48]
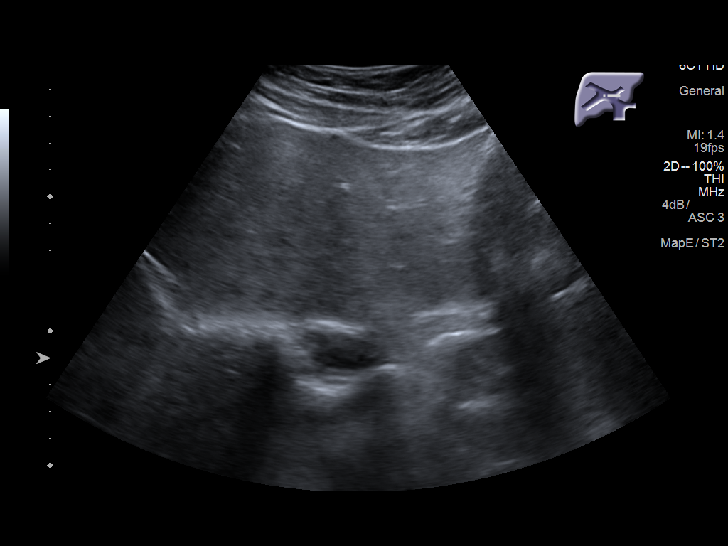
[im 26/48]
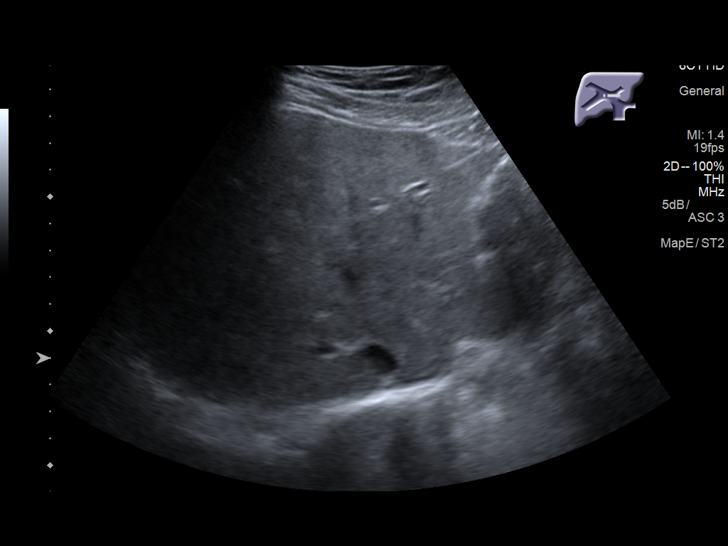
[im 30/48]
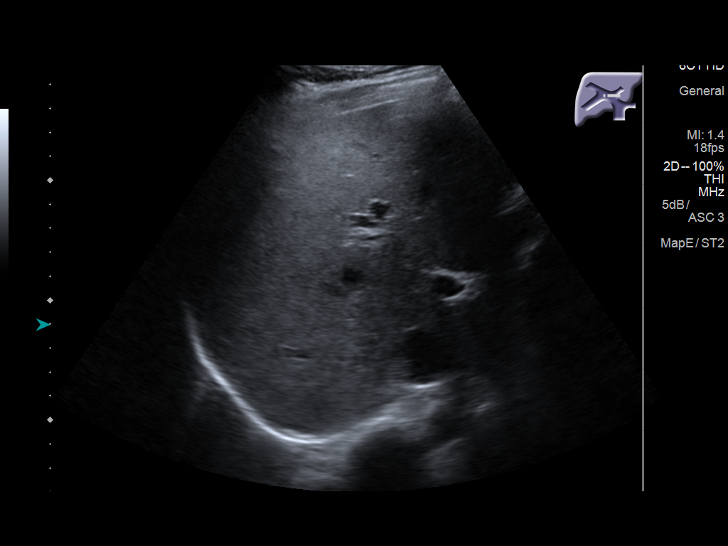
[im 32/48]
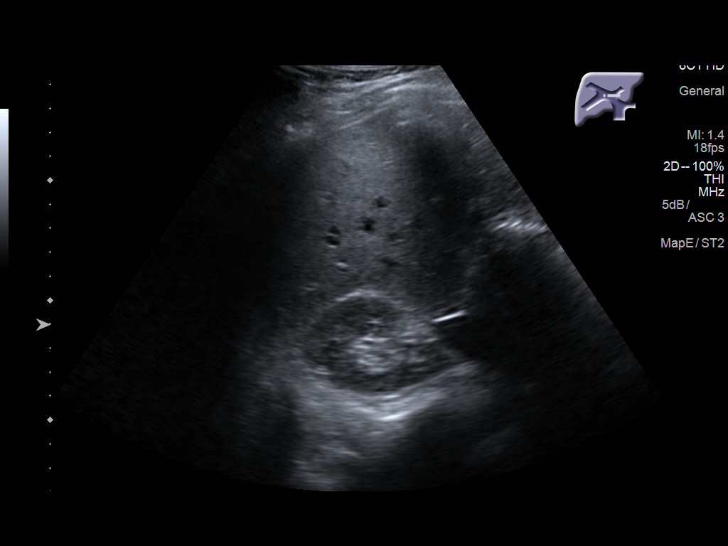
[im 36/48]
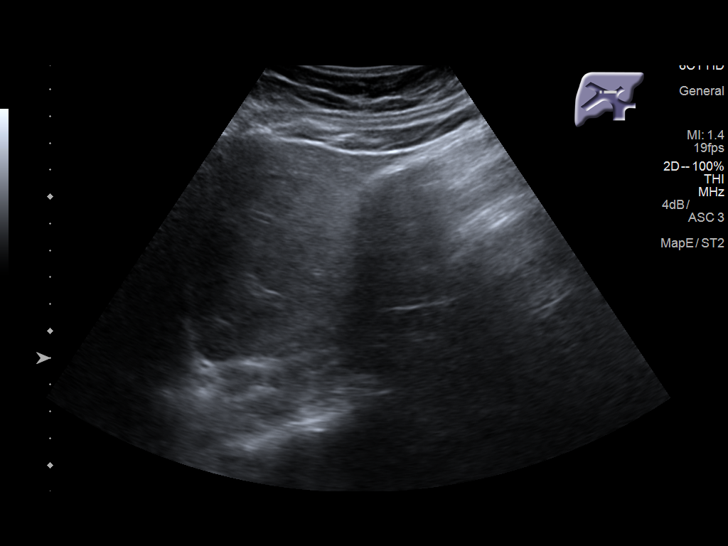
[im 40/48]
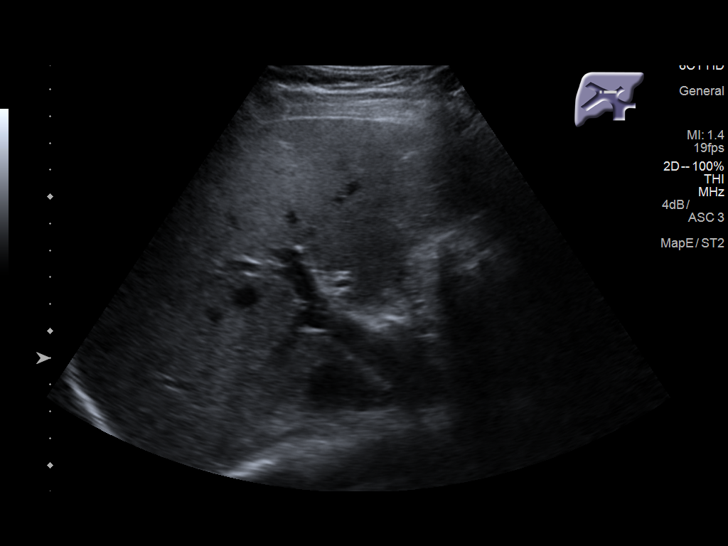
[im 44/48]
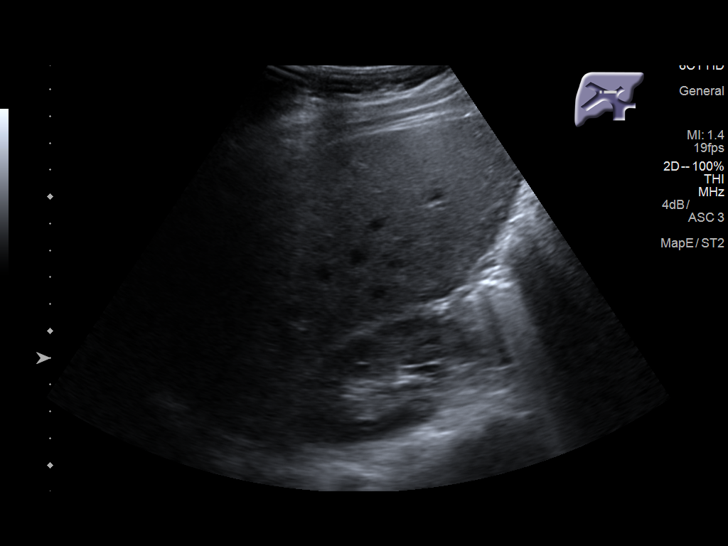
[im 48/48]
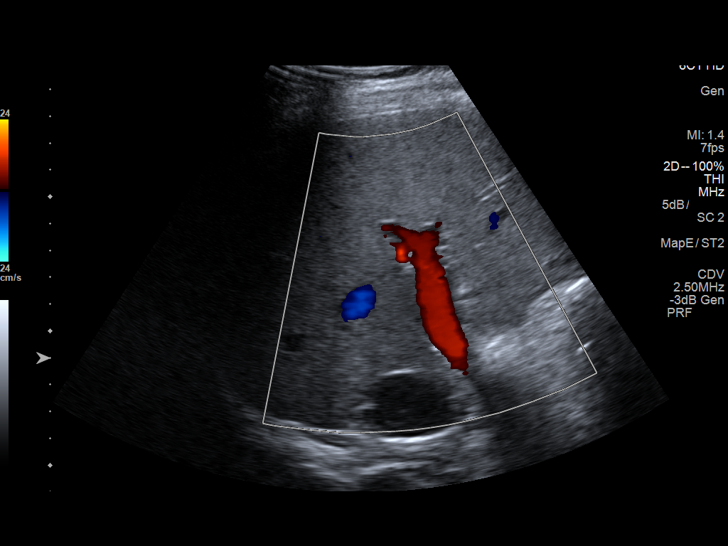

[14 of 25 positions shown; findings below may reference images not displayed]

FINDINGS: Gallbladder:

Contracted. No gallstones or wall thickening visualized. No
sonographic Murphy sign noted by sonographer.

Common bile duct:

Diameter: 2.4 mm.

Liver:

No focal lesion identified. Within normal limits in parenchymal
echogenicity. Portal vein is patent on color Doppler imaging with
normal direction of blood flow towards the liver.
IMPRESSION: Contracted gallbladder without gallstones or wall thickening. This
may represent recent p.o. intake or chronic gallbladder disease.
